# Patient Record
Sex: Male | Born: 1940
Health system: Southern US, Community
[De-identification: ages and names within clinical notes are randomized; demographics above are authoritative.]

## PROBLEM LIST (undated history)

## (undated) DIAGNOSIS — Z79899 Other long term (current) drug therapy: Secondary | ICD-10-CM

## (undated) DIAGNOSIS — Z8679 Personal history of other diseases of the circulatory system: Secondary | ICD-10-CM

## (undated) DIAGNOSIS — E7800 Pure hypercholesterolemia, unspecified: Secondary | ICD-10-CM

## (undated) DIAGNOSIS — I214 Non-ST elevation (NSTEMI) myocardial infarction: Secondary | ICD-10-CM

## (undated) DIAGNOSIS — J849 Interstitial pulmonary disease, unspecified: Secondary | ICD-10-CM

## (undated) DIAGNOSIS — I10 Essential (primary) hypertension: Secondary | ICD-10-CM

## (undated) DIAGNOSIS — I739 Peripheral vascular disease, unspecified: Secondary | ICD-10-CM

## (undated) DIAGNOSIS — K5790 Diverticulosis of intestine, part unspecified, without perforation or abscess without bleeding: Secondary | ICD-10-CM

## (undated) DIAGNOSIS — C61 Malignant neoplasm of prostate: Secondary | ICD-10-CM

## (undated) DIAGNOSIS — E119 Type 2 diabetes mellitus without complications: Secondary | ICD-10-CM

## (undated) DIAGNOSIS — I2510 Atherosclerotic heart disease of native coronary artery without angina pectoris: Secondary | ICD-10-CM

## (undated) DIAGNOSIS — I209 Angina pectoris, unspecified: Secondary | ICD-10-CM

## (undated) HISTORY — DX: Interstitial pulmonary disease, unspecified: J84.9

## (undated) HISTORY — DX: Atherosclerotic heart disease of native coronary artery without angina pectoris: I25.10

## (undated) HISTORY — DX: Angina pectoris, unspecified: I20.9

## (undated) HISTORY — DX: Non-ST elevation (NSTEMI) myocardial infarction: I21.4

## (undated) HISTORY — DX: Other long term (current) drug therapy: Z79.899

## (undated) HISTORY — PX: TONSILLECTOMY: SHX5217

## (undated) HISTORY — PX: PROSTATECTOMY: SHX69

## (undated) HISTORY — DX: Personal history of other diseases of the circulatory system: Z86.79

## (undated) HISTORY — DX: Essential (primary) hypertension: I10

## (undated) HISTORY — DX: Peripheral vascular disease, unspecified: I73.9

## (undated) HISTORY — PX: CORONARY ANGIOPLASTY: SHX604

## (undated) HISTORY — DX: Malignant neoplasm of prostate: C61

## (undated) HISTORY — DX: Pure hypercholesterolemia, unspecified: E78.00

## (undated) HISTORY — PX: CARDIAC CATHETERIZATION: SHX172

## (undated) HISTORY — DX: Diverticulosis of intestine, part unspecified, without perforation or abscess without bleeding: K57.90

## (undated) HISTORY — DX: Type 2 diabetes mellitus without complications: E11.9

---

## 2004-07-20 ENCOUNTER — Emergency Department (HOSPITAL_COMMUNITY): Admission: EM | Admit: 2004-07-20 | Discharge: 2004-07-20 | Payer: Self-pay | Admitting: Family Medicine

## 2008-04-18 ENCOUNTER — Inpatient Hospital Stay (HOSPITAL_COMMUNITY): Admission: AD | Admit: 2008-04-18 | Discharge: 2008-04-20 | Payer: Self-pay | Admitting: Cardiology

## 2008-04-18 DIAGNOSIS — Z8679 Personal history of other diseases of the circulatory system: Secondary | ICD-10-CM

## 2008-04-18 HISTORY — DX: Personal history of other diseases of the circulatory system: Z86.79

## 2008-04-19 ENCOUNTER — Encounter: Admission: RE | Admit: 2008-04-19 | Discharge: 2008-04-20 | Payer: Self-pay | Admitting: Cardiology

## 2008-04-19 ENCOUNTER — Encounter (INDEPENDENT_AMBULATORY_CARE_PROVIDER_SITE_OTHER): Payer: Self-pay | Admitting: Cardiology

## 2009-04-18 ENCOUNTER — Ambulatory Visit: Payer: Self-pay | Admitting: Cardiology

## 2010-06-09 ENCOUNTER — Inpatient Hospital Stay (HOSPITAL_COMMUNITY): Admission: RE | Admit: 2010-06-09 | Discharge: 2010-06-10 | Payer: Self-pay | Admitting: Cardiology

## 2010-06-17 HISTORY — PX: LEFT HEART CATH: SHX5946

## 2010-06-17 HISTORY — PX: PERCUTANEOUS CORONARY STENT INTERVENTION (PCI-S): SHX6016

## 2010-11-12 LAB — BASIC METABOLIC PANEL
BUN: 13 mg/dL (ref 6–23)
CO2: 27 mEq/L (ref 19–32)
Calcium: 8.6 mg/dL (ref 8.4–10.5)
Chloride: 107 mEq/L (ref 96–112)
Creatinine, Ser: 0.73 mg/dL (ref 0.4–1.5)
GFR calc Af Amer: >60 mL/min (ref >60)
GFR calc non Af Amer: >60 mL/min (ref >60)
Glucose, Bld: 130 mg/dL — ABNORMAL HIGH (ref 70–99)
Potassium: 4 mEq/L (ref 3.5–5.1)
Sodium: 139 mEq/L (ref 135–145)

## 2010-11-12 LAB — GLUCOSE, CAPILLARY
Glucose-Capillary: 100 mg/dL — ABNORMAL HIGH (ref 70–99)
Glucose-Capillary: 116 mg/dL — ABNORMAL HIGH (ref 70–99)
Glucose-Capillary: 128 mg/dL — ABNORMAL HIGH (ref 70–99)

## 2010-11-12 LAB — CBC
HCT: 39.4 % (ref 39.0–52.0)
Hemoglobin: 13.3 g/dL (ref 13.0–17.0)
MCH: 28.8 pg (ref 26.0–34.0)
MCHC: 33.8 g/dL (ref 30.0–36.0)
MCV: 85.3 fL (ref 78.0–100.0)
Platelets: 195 10*3/uL (ref 150–400)
RBC: 4.62 MIL/uL (ref 4.22–5.81)
RDW: 13.1 % (ref 11.5–15.5)
WBC: 8.7 10*3/uL (ref 4.0–10.5)

## 2011-01-16 NOTE — Discharge Summary (Signed)
NAME:  ARDIE, MCLENNAN NO.:  [id number]   MEDICAL RECORD NO.:  [id number]          PATIENT TYPE:  INP   LOCATION:  6522                         FACILITY:  [facility]   PHYSICIAN:  Francisca December, M.D.  DATE OF BIRTH:  09/29/1940   DATE OF ADMISSION:  04/18/2008  DATE OF DISCHARGE:  04/20/2008                               DISCHARGE SUMMARY   DISCHARGE DIAGNOSES:  1. Non-ST segment elevated myocardial infarction.  2. Coronary artery disease status post drug-eluting stents to the left      anterior descending and right coronary artery.  3. Hypertension.  4. Diabetes mellitus, diet-controlled.  5. Hyperlipidemia.  6. Family history of coronary artery disease.  7. History of prostate cancer.  8. Diverticulosis.   HOSPITAL COURSE:  Mr. Bluestein is a 70 year old male patient who was  admitted to [facility] on April 18, 2008, following a cardiac  catheterization on April 10, 2008, that showed two-vessel coronary  artery disease in the right coronary artery and left anterior descending  artery.  He was admitted for coronary intervention.   He underwent coronary intervention utilizing drug-eluting stents to  these vessels.  He tolerated the procedure well.  Interestingly, the  patient's cardiac markers increased periprocedure with a troponin as  high as 3.81.  Dr. Amil Amen felt that this elevation could be secondary  to a jailed acute marginal.  The patient was kept in the hospital  overnight to watch.  A 2-D echo was performed for precautionary measures  and this showed a normal EF with mild focal basal septal hypertrophy.  The patient was finally discharged to home on April 20, 2008.  He was  pain free.   DISCHARGE MEDICATIONS:  1. Plavix 75 mg a day for a minimum of 1 year.  2. Enteric-coated aspirin 325 mg a day.  3. Sublingual nitroglycerin p.r.n. chest pain.  4. Toprol-XL 25 mg a day.  5. Benazepril/hydrochlorothiazide 40/25 mg once a day.   DISCHARGE  INSTRUCTIONS:  The patient is to remain on a diabetic diet.  Clean cath site gently with soap and water.  No scrubbing.  Increase  activity slowly.  No lifting over 10 pounds for 1 week.  No driving for  2 days.  Follow up with Dr. Deitra Mayo nurse practitioner on  May 01, 2008 at 1:20 p.m.      Guy Franco, P.A.      Francisca December, M.D.  Electronically Signed    LB/MEDQ  D:  05/26/2008  T:  05/27/2008  Job:  [id number]

## 2013-10-02 ENCOUNTER — Telehealth: Payer: Self-pay | Admitting: *Deleted

## 2013-10-02 MED ORDER — METOPROLOL SUCCINATE ER 100 MG PO TB24: 100.0000 mg | ORAL_TABLET | Freq: Every day | ORAL | Status: DC

## 2013-10-02 NOTE — Telephone Encounter (Signed)
Rx sent in for pt.

## 2013-10-02 NOTE — Telephone Encounter (Signed)
Patient is out of metoprolol and requests a refill be sent to [facility] in [place]. Thanks, MI

## 2013-10-07 ENCOUNTER — Encounter: Payer: Self-pay | Admitting: *Deleted

## 2013-11-16 ENCOUNTER — Ambulatory Visit: Payer: Self-pay | Admitting: Cardiology

## 2013-11-24 ENCOUNTER — Ambulatory Visit: Payer: Self-pay | Admitting: Cardiology

## 2013-12-14 ENCOUNTER — Ambulatory Visit: Payer: Self-pay | Admitting: Cardiology

## 2014-01-08 ENCOUNTER — Encounter: Payer: Self-pay | Admitting: Cardiology

## 2014-01-08 ENCOUNTER — Ambulatory Visit (INDEPENDENT_AMBULATORY_CARE_PROVIDER_SITE_OTHER): Payer: Medicare Other | Admitting: Cardiology

## 2014-01-08 VITALS — BP 160/86 | HR 82 | Ht 65.0 in | Wt 217.0 lb

## 2014-01-08 DIAGNOSIS — I2510 Atherosclerotic heart disease of native coronary artery without angina pectoris: Secondary | ICD-10-CM | POA: Diagnosis not present

## 2014-01-08 DIAGNOSIS — I739 Peripheral vascular disease, unspecified: Secondary | ICD-10-CM | POA: Insufficient documentation

## 2014-01-08 DIAGNOSIS — E7800 Pure hypercholesterolemia, unspecified: Secondary | ICD-10-CM | POA: Insufficient documentation

## 2014-01-08 DIAGNOSIS — I10 Essential (primary) hypertension: Secondary | ICD-10-CM | POA: Diagnosis not present

## 2014-01-08 MED ORDER — ASPIRIN 81 MG PO TABS: 81.0000 mg | ORAL_TABLET | Freq: Every day | ORAL | Status: DC

## 2014-01-08 NOTE — Patient Instructions (Signed)
Your physician has recommended you make the following change in your medication:   1. Decrease Aspirin to 81 Mg 1 tablet daily  Your physician recommends that you return for a FASTING Lipid and ALT Profile. Please schedule before you leave today.  Your physician wants you to follow-up in: 1 year with Dr Mallie Snooks will receive a reminder letter in the mail two months in advance. If you don't receive a letter, please call our office to schedule the follow-up appointment.

## 2014-01-08 NOTE — Progress Notes (Signed)
[place], [place], [place]  [address] Phone: [phone] Fax:  [phone]  Date:  01/08/2014   ID:  Adam Washington, DOB [birth year], MRN [id number]  PCP:  No primary provider on file.  Cardiologist:  Fransico Him, MD     History of Present Illness: Adam Washington is a 73 y.o. male with a history of ASCAD s/p PCI of the mid LAD 8/09 and then platinumm study stent to the distal RCA complicated by NSTEMI from jailed stent with occlusion of the RV marginal and then PCI of the distal RCA 10/11.  He also has a history of dyslipidemia and HTN.  He is doing well.  He denies any chest pain, dizziness, palpitations or syncope.  He occasionally has some mild DOE which is stable.  He has some mild LE edema by the end of the day occasionally.  Wt Readings from Last 3 Encounters:  01/08/14 217 lb (98.431 kg)  05/15/13 218 lb 9.6 oz (99.156 kg)     Past Medical History  Diagnosis Date  . History of ASCVD 04/18/08    2 vessel. s/p drug-elutign stent, mid LAD  . NSTEMI (non-ST elevated myocardial infarction)     occluded RV marginal (stent jail)  . Angina pectoris   . Type II or unspecified type diabetes mellitus without mention of complication, not stated as uncontrolled   . Prostate carcinoma   . Diverticulosis   . Encounter for long-term (current) use of other medications   . Coronary atherosclerosis of native coronary artery     s/p PCI of the mid LAD 2009, paltinum study stent RCA 4235 complicated by NSTEMI from occluded RV marginal from jailed stent, repeat cath 2011 PCI of distal RCA  . Essential hypertension, benign   . Pure hypercholesterolemia   . PVD (peripheral vascular disease)     abnormal LE arterial dopplers with mild nonobstructive disease throughout RLE, moderate mid left SFA stenosis, normal resting ABI's    Current Outpatient Prescriptions  Medication Sig Dispense Refill  . amLODipine (NORVASC) 5 MG tablet Take 5 mg by mouth daily.      Marland Kitchen aspirin 325 MG tablet  Take 325 mg by mouth daily.      . benazepril-hydrochlorthiazide (LOTENSIN HCT) 20-12.5 MG per tablet Take 1 tablet by mouth daily.      Marland Kitchen glipiZIDE (GLUCOTROL XL) 10 MG 24 hr tablet       . metoprolol succinate (TOPROL-XL) 100 MG 24 hr tablet Take 1 tablet (100 mg total) by mouth daily. Take with or immediately following a meal.  30 tablet  11  . nitroGLYCERIN (NITROSTAT) 0.4 MG SL tablet Place 0.4 mg under the tongue every 5 (five) minutes as needed for chest pain.      . pioglitazone-metformin (ACTOPLUS MET) 15-500 MG per tablet Take 1 tablet by mouth daily.      . rosuvastatin (CRESTOR) 40 MG tablet Take 40 mg by mouth daily.       No current facility-administered medications for this visit.    Allergies:    Allergies  Allergen Reactions  . Plavix [Clopidogrel Bisulfate] Hives    Social History:  The patient  reports that he has quit smoking. He does not have any smokeless tobacco history on file. He reports that he drinks alcohol. He reports that he does not use illicit drugs.   Family History:  The patient's family history includes Alzheimer's disease in his mother; Cancer in his brother and father; Heart attack in his  brother, brother, father, and mother; Heart disease in his brother, brother, father, and mother.   ROS:  Please see the history of present illness.      All other systems reviewed and negative.   PHYSICAL EXAM: VS:  BP 160/86  Pulse 82  Ht $R'5\' 5"'Jm$  (1.651 m)  Wt 217 lb (98.431 kg)  BMI 36.11 kg/m2 Well nourished, well developed, in no acute distress HEENT: normal Neck: no JVD Cardiac:  normal S1, S2; RRR; no murmur Lungs:  clear to auscultation bilaterally, no wheezing, rhonchi or rales Abd: soft, nontender, no hepatomegaly Ext: no edema Skin: warm and dry Neuro:  CNs 2-12 intact, no focal abnormalities noted  EKG:     NSR with LVH by voltage  ASSESSMENT AND PLAN:  1. ASCAD with history of PCI of mid LAD and distal RCA x 2 with no current angina -  decrease to ASA to $Remo'81mg'VAaEw$  daily 2. HTN elevated today but he did not take his BP meds yesterday - continue Amlodipine/Lotensin HCT/Toprol 3. Dyslipidemia - continue Crestor - check fasting lipid and ALT  Followup with me in 1 year  Signed, Fransico Him, MD 01/08/2014 10:34 AM

## 2014-01-09 ENCOUNTER — Other Ambulatory Visit: Payer: Self-pay

## 2014-01-09 MED ORDER — BENAZEPRIL-HYDROCHLOROTHIAZIDE 20-12.5 MG PO TABS: 1.0000 | ORAL_TABLET | Freq: Every day | ORAL | Status: DC

## 2014-01-11 DIAGNOSIS — E1139 Type 2 diabetes mellitus with other diabetic ophthalmic complication: Secondary | ICD-10-CM | POA: Diagnosis not present

## 2014-01-11 DIAGNOSIS — Z961 Presence of intraocular lens: Secondary | ICD-10-CM | POA: Diagnosis not present

## 2014-01-11 DIAGNOSIS — H35379 Puckering of macula, unspecified eye: Secondary | ICD-10-CM | POA: Diagnosis not present

## 2014-01-11 DIAGNOSIS — E11319 Type 2 diabetes mellitus with unspecified diabetic retinopathy without macular edema: Secondary | ICD-10-CM | POA: Diagnosis not present

## 2014-01-15 ENCOUNTER — Other Ambulatory Visit: Payer: Self-pay | Admitting: General Surgery

## 2014-01-15 ENCOUNTER — Other Ambulatory Visit (INDEPENDENT_AMBULATORY_CARE_PROVIDER_SITE_OTHER): Payer: Medicare Other

## 2014-01-15 DIAGNOSIS — E7800 Pure hypercholesterolemia, unspecified: Secondary | ICD-10-CM | POA: Diagnosis not present

## 2014-01-15 DIAGNOSIS — Z1211 Encounter for screening for malignant neoplasm of colon: Secondary | ICD-10-CM | POA: Diagnosis not present

## 2014-01-15 DIAGNOSIS — I10 Essential (primary) hypertension: Secondary | ICD-10-CM | POA: Diagnosis not present

## 2014-01-15 DIAGNOSIS — IMO0001 Reserved for inherently not codable concepts without codable children: Secondary | ICD-10-CM | POA: Diagnosis not present

## 2014-01-15 DIAGNOSIS — I2510 Atherosclerotic heart disease of native coronary artery without angina pectoris: Secondary | ICD-10-CM | POA: Diagnosis not present

## 2014-01-15 LAB — LIPID PANEL
Cholesterol: 159 mg/dL (ref 0–200)
HDL: 41.3 mg/dL (ref >39.00)
LDL Cholesterol: 88 mg/dL (ref 0–99)
Total CHOL/HDL Ratio: 4
Triglycerides: 147 mg/dL (ref 0.0–149.0)
VLDL: 29.4 mg/dL (ref 0.0–40.0)

## 2014-01-15 LAB — HEPATIC FUNCTION PANEL
ALT: 22 U/L (ref 0–53)
AST: 26 U/L (ref 0–37)
Albumin: 4 g/dL (ref 3.5–5.2)
Alkaline Phosphatase: 52 U/L (ref 39–117)
Bilirubin, Direct: 0.1 mg/dL (ref 0.0–0.3)
Total Bilirubin: 0.7 mg/dL (ref 0.2–1.2)
Total Protein: 7.4 g/dL (ref 6.0–8.3)

## 2014-01-15 MED ORDER — BENAZEPRIL-HYDROCHLOROTHIAZIDE 20-12.5 MG PO TABS: 2.0000 | ORAL_TABLET | Freq: Every day | ORAL | Status: DC

## 2014-01-25 ENCOUNTER — Encounter: Payer: Self-pay | Admitting: Cardiology

## 2014-01-26 ENCOUNTER — Encounter: Payer: Self-pay | Admitting: Cardiology

## 2014-02-07 ENCOUNTER — Other Ambulatory Visit: Payer: Self-pay | Admitting: *Deleted

## 2014-02-07 MED ORDER — AMLODIPINE BESYLATE 5 MG PO TABS: 5.0000 mg | ORAL_TABLET | Freq: Every day | ORAL | Status: DC

## 2014-02-12 ENCOUNTER — Encounter: Payer: Self-pay | Admitting: General Surgery

## 2014-02-12 ENCOUNTER — Other Ambulatory Visit: Payer: Self-pay | Admitting: General Surgery

## 2014-02-12 DIAGNOSIS — E7800 Pure hypercholesterolemia, unspecified: Secondary | ICD-10-CM

## 2014-02-12 MED ORDER — EZETIMIBE 10 MG PO TABS: 10.0000 mg | ORAL_TABLET | Freq: Every day | ORAL | Status: DC

## 2014-03-08 ENCOUNTER — Other Ambulatory Visit: Payer: Self-pay

## 2014-03-08 MED ORDER — BENAZEPRIL-HYDROCHLOROTHIAZIDE 20-12.5 MG PO TABS: 1.0000 | ORAL_TABLET | Freq: Two times a day (BID) | ORAL | Status: DC

## 2014-03-23 ENCOUNTER — Telehealth: Payer: Self-pay | Admitting: Cardiology

## 2014-03-23 MED ORDER — HYDROCHLOROTHIAZIDE 12.5 MG PO CAPS: 12.5000 mg | ORAL_CAPSULE | Freq: Every day | ORAL | Status: DC

## 2014-03-23 MED ORDER — BENAZEPRIL HCL 20 MG PO TABS: 20.0000 mg | ORAL_TABLET | Freq: Every day | ORAL | Status: DC

## 2014-03-23 NOTE — Telephone Encounter (Signed)
Spoke with pt and pts wife to let them know both rx's have been sent in.

## 2014-03-23 NOTE — Telephone Encounter (Signed)
New message     Insurance will not pay for benazepril/HCTZ together.  Need separate pres [facility] to [facility].  They want the HCTZ capsule.  He is out of medication

## 2014-04-24 DIAGNOSIS — H43819 Vitreous degeneration, unspecified eye: Secondary | ICD-10-CM | POA: Diagnosis not present

## 2014-04-24 DIAGNOSIS — D3130 Benign neoplasm of unspecified choroid: Secondary | ICD-10-CM | POA: Diagnosis not present

## 2014-04-24 DIAGNOSIS — E1139 Type 2 diabetes mellitus with other diabetic ophthalmic complication: Secondary | ICD-10-CM | POA: Diagnosis not present

## 2014-04-24 DIAGNOSIS — E11329 Type 2 diabetes mellitus with mild nonproliferative diabetic retinopathy without macular edema: Secondary | ICD-10-CM | POA: Diagnosis not present

## 2014-04-24 DIAGNOSIS — H35379 Puckering of macula, unspecified eye: Secondary | ICD-10-CM | POA: Diagnosis not present

## 2014-04-30 DIAGNOSIS — J311 Chronic nasopharyngitis: Secondary | ICD-10-CM | POA: Diagnosis not present

## 2014-04-30 DIAGNOSIS — J310 Chronic rhinitis: Secondary | ICD-10-CM | POA: Diagnosis not present

## 2014-04-30 DIAGNOSIS — R059 Cough, unspecified: Secondary | ICD-10-CM | POA: Diagnosis not present

## 2014-04-30 DIAGNOSIS — G479 Sleep disorder, unspecified: Secondary | ICD-10-CM | POA: Diagnosis not present

## 2014-04-30 DIAGNOSIS — R05 Cough: Secondary | ICD-10-CM | POA: Diagnosis not present

## 2014-05-15 ENCOUNTER — Encounter: Payer: Self-pay | Admitting: General Surgery

## 2014-05-15 ENCOUNTER — Other Ambulatory Visit (INDEPENDENT_AMBULATORY_CARE_PROVIDER_SITE_OTHER): Payer: Medicare Other

## 2014-05-15 DIAGNOSIS — E7800 Pure hypercholesterolemia, unspecified: Secondary | ICD-10-CM

## 2014-05-15 LAB — LIPID PANEL
Cholesterol: 124 mg/dL (ref 0–200)
HDL: 32.9 mg/dL — ABNORMAL LOW (ref >39.00)
LDL Cholesterol: 62 mg/dL (ref 0–99)
NonHDL: 91.1
Total CHOL/HDL Ratio: 4
Triglycerides: 146 mg/dL (ref 0.0–149.0)
VLDL: 29.2 mg/dL (ref 0.0–40.0)

## 2014-05-15 LAB — HEPATIC FUNCTION PANEL
ALT: 28 U/L (ref 0–53)
AST: 35 U/L (ref 0–37)
Albumin: 3.9 g/dL (ref 3.5–5.2)
Alkaline Phosphatase: 58 U/L (ref 39–117)
Bilirubin, Direct: 0 mg/dL (ref 0.0–0.3)
Total Bilirubin: 0.7 mg/dL (ref 0.2–1.2)
Total Protein: 7.6 g/dL (ref 6.0–8.3)

## 2014-07-30 DIAGNOSIS — E780 Pure hypercholesterolemia: Secondary | ICD-10-CM | POA: Diagnosis not present

## 2014-07-30 DIAGNOSIS — Z1389 Encounter for screening for other disorder: Secondary | ICD-10-CM | POA: Diagnosis not present

## 2014-07-30 DIAGNOSIS — C61 Malignant neoplasm of prostate: Secondary | ICD-10-CM | POA: Diagnosis not present

## 2014-07-30 DIAGNOSIS — R05 Cough: Secondary | ICD-10-CM | POA: Diagnosis not present

## 2014-07-30 DIAGNOSIS — E1165 Type 2 diabetes mellitus with hyperglycemia: Secondary | ICD-10-CM | POA: Diagnosis not present

## 2014-07-30 DIAGNOSIS — Z9181 History of falling: Secondary | ICD-10-CM | POA: Diagnosis not present

## 2014-07-30 DIAGNOSIS — I2510 Atherosclerotic heart disease of native coronary artery without angina pectoris: Secondary | ICD-10-CM | POA: Diagnosis not present

## 2014-08-25 DIAGNOSIS — R509 Fever, unspecified: Secondary | ICD-10-CM | POA: Diagnosis not present

## 2014-08-25 DIAGNOSIS — R05 Cough: Secondary | ICD-10-CM | POA: Diagnosis not present

## 2014-08-25 DIAGNOSIS — J984 Other disorders of lung: Secondary | ICD-10-CM | POA: Diagnosis not present

## 2014-09-04 DIAGNOSIS — J189 Pneumonia, unspecified organism: Secondary | ICD-10-CM | POA: Diagnosis not present

## 2014-09-04 DIAGNOSIS — Z1389 Encounter for screening for other disorder: Secondary | ICD-10-CM | POA: Diagnosis not present

## 2014-09-04 DIAGNOSIS — Z9181 History of falling: Secondary | ICD-10-CM | POA: Diagnosis not present

## 2014-09-05 ENCOUNTER — Other Ambulatory Visit: Payer: Self-pay

## 2014-09-05 MED ORDER — ROSUVASTATIN CALCIUM 40 MG PO TABS: 40.0000 mg | ORAL_TABLET | Freq: Every day | ORAL | Status: DC

## 2014-10-15 ENCOUNTER — Encounter: Payer: Self-pay | Admitting: Cardiology

## 2014-11-23 ENCOUNTER — Other Ambulatory Visit: Payer: Self-pay

## 2014-11-23 MED ORDER — METOPROLOL SUCCINATE ER 100 MG PO TB24: 100.0000 mg | ORAL_TABLET | Freq: Every day | ORAL | Status: DC

## 2015-01-10 NOTE — Progress Notes (Signed)
Cardiology Office Note   Date:  01/11/2015   ID:  Felix Pacini, DOB 1941/08/23, MRN [id number]  PCP:  Fae Pippin    Chief Complaint  Patient presents with  . Coronary Artery Disease  . Follow-up    hypertension  . Follow-up    hyperlipidemia      History of Present Illness: Roshun Klingensmith is a 74 y.o. male with a history of ASCAD s/p PCI of the mid LAD 8/09 and then platinumm study stent to the distal RCA complicated by NSTEMI from jailed stent with occlusion of the RV marginal and then PCI of the distal RCA 10/11. He also has a history of dyslipidemia and HTN. He is doing well. He denies any chest pain, dizziness, palpitations or syncope. He occasionally has some mild DOE but he thinks it has gotten worse since his PNA in December. He has some mild LE edema by the end of the day occasionally.    Past Medical History  Diagnosis Date  . History of ASCVD 04/18/08    2 vessel. s/p drug-elutign stent, mid LAD  . NSTEMI (non-ST elevated myocardial infarction)     occluded RV marginal (stent jail)  . Angina pectoris   . Type II or unspecified type diabetes mellitus without mention of complication, not stated as uncontrolled   . Prostate carcinoma   . Diverticulosis   . Encounter for long-term (current) use of other medications   . Coronary atherosclerosis of native coronary artery     s/p PCI of the mid LAD 2009, paltinum study stent RCA 0109 complicated by NSTEMI from occluded RV marginal from jailed stent, repeat cath 2011 PCI of distal RCA  . Essential hypertension, benign   . Pure hypercholesterolemia   . PVD (peripheral vascular disease)     abnormal LE arterial dopplers with mild nonobstructive disease throughout RLE, moderate mid left SFA stenosis, normal resting ABI's    Past Surgical History  Procedure Laterality Date  . Prostatectomy      total  . Tonsillectomy      remote  . Percutaneous coronary stent intervention (pci-s)  06/17/10  . Left heart  cath  06/17/10     Current Outpatient Prescriptions  Medication Sig Dispense Refill  . amLODipine (NORVASC) 5 MG tablet Take 1 tablet (5 mg total) by mouth daily. 90 tablet 1  . aspirin 81 MG tablet Take 1 tablet (81 mg total) by mouth daily.    . benazepril-hydrochlorthiazide (LOTENSIN HCT) 20-12.5 MG per tablet Take 1 tablet by mouth daily.    Marland Kitchen ezetimibe (ZETIA) 10 MG tablet Take 1 tablet (10 mg total) by mouth daily. 90 tablet 3  . glipiZIDE (GLUCOTROL XL) 10 MG 24 hr tablet     . hydrochlorothiazide (MICROZIDE) 12.5 MG capsule Take 1 capsule (12.5 mg total) by mouth daily. 90 capsule 2  . metoprolol succinate (TOPROL-XL) 100 MG 24 hr tablet Take 1 tablet (100 mg total) by mouth daily. Take with or immediately following a meal. 30 tablet 6  . nitroGLYCERIN (NITROSTAT) 0.4 MG SL tablet Place 0.4 mg under the tongue every 5 (five) minutes as needed for chest pain.    . pioglitazone-metformin (ACTOPLUS MET) 15-500 MG per tablet Take 1 tablet by mouth daily.    . rosuvastatin (CRESTOR) 40 MG tablet Take 1 tablet (40 mg total) by mouth daily. 30 tablet 6   No current facility-administered medications for this visit.    Allergies:   Plavix    Social History:  The patient  reports that he has quit smoking. He does not have any smokeless tobacco history on file. He reports that he drinks alcohol. He reports that he does not use illicit drugs.   Family History:  The patient's family history includes Alzheimer's disease in his mother; Cancer in his brother and father; Heart attack in his brother, brother, father, and mother; Heart disease in his brother, brother, father, and mother.    ROS:  Please see the history of present illness.   Otherwise, review of systems are positive for none.   All other systems are reviewed and negative.    PHYSICAL EXAM: VS:  BP 148/78 mmHg  Pulse 84  Ht $R'5\' 5"'kU$  (1.651 m)  Wt 208 lb 6.4 oz (94.53 kg)  BMI 34.68 kg/m2 , BMI Body mass index is 34.68  kg/(m^2). GEN: Well nourished, well developed, in no acute distress HEENT: normal Neck: no JVD, carotid bruits, or masses Cardiac: RRR; no murmurs, rubs, or gallops,no edema  Respiratory:  clear to auscultation bilaterally, normal work of breathing GI: soft, nontender, nondistended, + BS MS: no deformity or atrophy Skin: warm and dry, no rash Neuro:  Strength and sensation are intact Psych: euthymic mood, full affect   EKG:  EKG was ordered today and showed NSR at 84bpm with no ST changes and LVH by voltage    Recent Labs: 05/15/2014: ALT 28    Lipid Panel    Component Value Date/Time   CHOL 124 05/15/2014 0741   TRIG 146.0 05/15/2014 0741   HDL 32.90* 05/15/2014 0741   CHOLHDL 4 05/15/2014 0741   VLDL 29.2 05/15/2014 0741   LDLCALC 62 05/15/2014 0741      Wt Readings from Last 3 Encounters:  01/11/15 208 lb 6.4 oz (94.53 kg)  01/08/14 217 lb (98.431 kg)  05/15/13 218 lb 9.6 oz (99.156 kg)     ASSESSMENT AND PLAN: 1.  ASCAD with history of PCI of mid LAD and distal RCA x 2 with no current angina but does have worsening DOE.  This most likely is due to recent PNA but he has not had a noninvasive ischemic w/u in some time.   - continue ASA - I will get a Lexiscan myoview to evaluate 2.  HTN - borderline controlled - continue Amlodipine/Lotensin HCT/Toprol 3.  Dyslipidemia - continue Crestor/Zetia - check fasting lipid and ALT 4.  Atypical moles - he has 2 irregular appearing nevi on his left forearm that I recommended that he see his PCP to get biopsied.   Current medicines are reviewed at length with the patient today.  The patient does not have concerns regarding medicines.  The following changes have been made:  no change  Labs/ tests ordered today include: see above assessment and plan No orders of the defined types were placed in this encounter.     Disposition:   FU with me in 6 months   Signed, Sueanne Margarita, MD  01/11/2015 3:20 PM    [facility] [address], [place], [place]  [address] Phone: [phone]; Fax: [phone]

## 2015-01-11 ENCOUNTER — Encounter: Payer: Self-pay | Admitting: Cardiology

## 2015-01-11 ENCOUNTER — Ambulatory Visit (INDEPENDENT_AMBULATORY_CARE_PROVIDER_SITE_OTHER): Payer: Medicare Other | Admitting: Cardiology

## 2015-01-11 VITALS — BP 148/78 | HR 84 | Ht 65.0 in | Wt 208.4 lb

## 2015-01-11 DIAGNOSIS — R0602 Shortness of breath: Secondary | ICD-10-CM | POA: Insufficient documentation

## 2015-01-11 DIAGNOSIS — I10 Essential (primary) hypertension: Secondary | ICD-10-CM | POA: Diagnosis not present

## 2015-01-11 DIAGNOSIS — I2510 Atherosclerotic heart disease of native coronary artery without angina pectoris: Secondary | ICD-10-CM

## 2015-01-11 DIAGNOSIS — E7800 Pure hypercholesterolemia, unspecified: Secondary | ICD-10-CM

## 2015-01-11 NOTE — Patient Instructions (Addendum)
Medication Instructions:  Your physician recommends that you continue on your current medications as directed. Please refer to the Current Medication list given to you today.   Labwork: Your physician recommends that you return for FASTING lab work the [facility] as your stress test. (BMET, lipids, LFTs)  Testing/Procedures: Your physician has requested that you have a lexiscan myoview. For further information please visit [facility]uge[facility]iesta[facility]tn. Please follow instruction sheet, as given.  Follow-Up: Your physician wants you to follow-up in: 6 months with Dr. Radford Pax. You will receive a reminder letter in the mail two months in advance. If you don't receive a letter, please call our office to schedule the follow-up appointment.   Any Other Special Instructions Will Be Listed Below (If Applicable).

## 2015-01-15 DIAGNOSIS — H35371 Puckering of macula, right eye: Secondary | ICD-10-CM | POA: Diagnosis not present

## 2015-01-15 DIAGNOSIS — D3131 Benign neoplasm of right choroid: Secondary | ICD-10-CM | POA: Diagnosis not present

## 2015-01-15 DIAGNOSIS — E11329 Type 2 diabetes mellitus with mild nonproliferative diabetic retinopathy without macular edema: Secondary | ICD-10-CM | POA: Diagnosis not present

## 2015-01-15 DIAGNOSIS — H43813 Vitreous degeneration, bilateral: Secondary | ICD-10-CM | POA: Diagnosis not present

## 2015-01-22 ENCOUNTER — Telehealth (HOSPITAL_COMMUNITY): Payer: Self-pay | Admitting: *Deleted

## 2015-01-22 NOTE — Telephone Encounter (Signed)
Patient given detailed instructions per Myocardial Perfusion Study Information Sheet for test on 01/25/15 at 0745. Patient was asked to change appointment to 0745 instead of 0945. Patient stated that was fine,Patient verbalized understanding. Armani Gawlik, Ranae Palms

## 2015-01-24 DIAGNOSIS — Z6832 Body mass index (BMI) 32.0-32.9, adult: Secondary | ICD-10-CM | POA: Diagnosis not present

## 2015-01-24 DIAGNOSIS — I2510 Atherosclerotic heart disease of native coronary artery without angina pectoris: Secondary | ICD-10-CM | POA: Diagnosis not present

## 2015-01-24 DIAGNOSIS — I10 Essential (primary) hypertension: Secondary | ICD-10-CM | POA: Diagnosis not present

## 2015-01-24 DIAGNOSIS — E1165 Type 2 diabetes mellitus with hyperglycemia: Secondary | ICD-10-CM | POA: Diagnosis not present

## 2015-01-24 DIAGNOSIS — Z1389 Encounter for screening for other disorder: Secondary | ICD-10-CM | POA: Diagnosis not present

## 2015-01-24 DIAGNOSIS — L989 Disorder of the skin and subcutaneous tissue, unspecified: Secondary | ICD-10-CM | POA: Diagnosis not present

## 2015-01-25 ENCOUNTER — Other Ambulatory Visit (INDEPENDENT_AMBULATORY_CARE_PROVIDER_SITE_OTHER): Payer: Medicare Other | Admitting: *Deleted

## 2015-01-25 ENCOUNTER — Ambulatory Visit (HOSPITAL_COMMUNITY): Payer: Medicare Other | Attending: Cardiology

## 2015-01-25 DIAGNOSIS — E7800 Pure hypercholesterolemia, unspecified: Secondary | ICD-10-CM

## 2015-01-25 DIAGNOSIS — I10 Essential (primary) hypertension: Secondary | ICD-10-CM

## 2015-01-25 DIAGNOSIS — R0602 Shortness of breath: Secondary | ICD-10-CM | POA: Diagnosis not present

## 2015-01-25 LAB — MYOCARDIAL PERFUSION IMAGING
CHL CUP NUCLEAR SDS: 1
CHL CUP NUCLEAR SRS: 9
CHL CUP NUCLEAR SSS: 10
CHL CUP STRESS STAGE 1 SPEED: 0 mph
CHL CUP STRESS STAGE 2 SPEED: 0 mph
CHL CUP STRESS STAGE 3 GRADE: 0 %
CHL CUP STRESS STAGE 3 SPEED: 0 mph
CHL CUP STRESS STAGE 4 HR: 89 {beats}/min
CHL CUP STRESS STAGE 4 SPEED: 0 mph
CHL CUP STRESS STAGE 5 GRADE: 0 %
CSEPPHR: 89 {beats}/min
Estimated workload: 1 METS
LV sys vol: 58 mL
LVDIAVOL: 120 mL
Nuc Stress EF: 52 %
Percent of predicted max HR: 60 %
RATE: 0.38
Rest HR: 71 {beats}/min
Stage 1 DBP: 83 mmHg
Stage 1 Grade: 0 %
Stage 1 HR: 76 {beats}/min
Stage 1 SBP: 183 mmHg
Stage 2 Grade: 0 %
Stage 2 HR: 76 {beats}/min
Stage 3 DBP: 69 mmHg
Stage 3 HR: 91 {beats}/min
Stage 3 SBP: 182 mmHg
Stage 4 Grade: 0 %
Stage 5 DBP: 71 mmHg
Stage 5 HR: 88 {beats}/min
Stage 5 SBP: 177 mmHg
Stage 5 Speed: 0 mph
Stage 6 Grade: 0 %
Stage 6 HR: 75 {beats}/min
Stage 6 Speed: 0 mph
TID: 1.09

## 2015-01-25 LAB — BASIC METABOLIC PANEL
BUN: 19 mg/dL (ref 6–23)
CO2: 33 mEq/L — ABNORMAL HIGH (ref 19–32)
Calcium: 9.2 mg/dL (ref 8.4–10.5)
Chloride: 100 mEq/L (ref 96–112)
Creatinine, Ser: 0.81 mg/dL (ref 0.40–1.50)
GFR: 99.01 mL/min (ref >60.00)
GLUCOSE: 127 mg/dL — AB (ref 70–99)
Potassium: 4.3 mEq/L (ref 3.5–5.1)
Sodium: 136 mEq/L (ref 135–145)

## 2015-01-25 LAB — HEPATIC FUNCTION PANEL
ALT: 12 U/L (ref 0–53)
AST: 18 U/L (ref 0–37)
Albumin: 4.3 g/dL (ref 3.5–5.2)
Alkaline Phosphatase: 52 U/L (ref 39–117)
BILIRUBIN DIRECT: 0.2 mg/dL (ref 0.0–0.3)
BILIRUBIN TOTAL: 0.7 mg/dL (ref 0.2–1.2)
TOTAL PROTEIN: 7.5 g/dL (ref 6.0–8.3)

## 2015-01-25 LAB — LIPID PANEL
Cholesterol: 116 mg/dL (ref 0–200)
HDL: 37.9 mg/dL — AB (ref >39.00)
LDL Cholesterol: 63 mg/dL (ref 0–99)
NonHDL: 78.1
Total CHOL/HDL Ratio: 3
Triglycerides: 76 mg/dL (ref 0.0–149.0)
VLDL: 15.2 mg/dL (ref 0.0–40.0)

## 2015-01-25 MED ORDER — TECHNETIUM TC 99M SESTAMIBI GENERIC - CARDIOLITE
11.0000 | Freq: Once | INTRAVENOUS | Status: AC | PRN
  Administered 2015-01-25: 11 via INTRAVENOUS

## 2015-01-25 MED ORDER — REGADENOSON 0.4 MG/5ML IV SOLN
0.4000 mg | Freq: Once | INTRAVENOUS | Status: AC
  Administered 2015-01-25: 0.4 mg via INTRAVENOUS

## 2015-01-25 MED ORDER — TECHNETIUM TC 99M SESTAMIBI GENERIC - CARDIOLITE
33.0000 | Freq: Once | INTRAVENOUS | Status: AC | PRN
  Administered 2015-01-25: 33 via INTRAVENOUS

## 2015-02-07 DIAGNOSIS — L989 Disorder of the skin and subcutaneous tissue, unspecified: Secondary | ICD-10-CM | POA: Diagnosis not present

## 2015-02-07 DIAGNOSIS — L819 Disorder of pigmentation, unspecified: Secondary | ICD-10-CM | POA: Diagnosis not present

## 2015-02-07 DIAGNOSIS — L929 Granulomatous disorder of the skin and subcutaneous tissue, unspecified: Secondary | ICD-10-CM | POA: Diagnosis not present

## 2015-03-08 ENCOUNTER — Other Ambulatory Visit: Payer: Self-pay

## 2015-03-08 MED ORDER — AMLODIPINE BESYLATE 5 MG PO TABS
5.0000 mg | ORAL_TABLET | Freq: Every day | ORAL | Status: DC
Start: 2015-03-08 — End: 2016-06-15

## 2015-04-12 ENCOUNTER — Encounter: Payer: Self-pay | Admitting: Cardiology

## 2015-04-19 ENCOUNTER — Other Ambulatory Visit: Payer: Self-pay

## 2015-04-19 MED ORDER — NITROGLYCERIN 0.4 MG SL SUBL: 0.4000 mg | SUBLINGUAL_TABLET | SUBLINGUAL | Status: AC | PRN

## 2015-06-10 ENCOUNTER — Other Ambulatory Visit: Payer: Self-pay | Admitting: Cardiology

## 2015-06-13 ENCOUNTER — Other Ambulatory Visit: Payer: Self-pay | Admitting: Cardiology

## 2015-06-14 ENCOUNTER — Telehealth: Payer: Self-pay | Admitting: Cardiology

## 2015-06-14 NOTE — Telephone Encounter (Signed)
Was sent in 06/13/15 for #90 with 1 refill to [facility]

## 2015-06-14 NOTE — Telephone Encounter (Signed)
New message        STAT if patient is at the pharmacy , call can be transferred to refill team.   1. Which medications need to be refilled?  HCTZ 12.5mg  2. Which pharmacy/location is medication to be sent to? [facility] 3. Do they need a 30 day or 90 day supply? Want 90day

## 2015-07-11 ENCOUNTER — Ambulatory Visit
Admission: RE | Admit: 2015-07-11 | Discharge: 2015-07-11 | Disposition: A | Discharge status: Home / Self Care | Payer: Medicare Other | Source: Ambulatory Visit | Attending: Unknown Physician Specialty | Admitting: Unknown Physician Specialty

## 2015-07-11 ENCOUNTER — Other Ambulatory Visit: Payer: Self-pay | Admitting: Unknown Physician Specialty

## 2015-07-11 DIAGNOSIS — R059 Cough, unspecified: Secondary | ICD-10-CM

## 2015-07-11 DIAGNOSIS — R05 Cough: Secondary | ICD-10-CM

## 2015-07-11 DIAGNOSIS — I252 Old myocardial infarction: Secondary | ICD-10-CM | POA: Insufficient documentation

## 2015-07-11 DIAGNOSIS — E1151 Type 2 diabetes mellitus with diabetic peripheral angiopathy without gangrene: Secondary | ICD-10-CM | POA: Diagnosis not present

## 2015-07-11 DIAGNOSIS — R0602 Shortness of breath: Secondary | ICD-10-CM | POA: Diagnosis not present

## 2015-07-11 DIAGNOSIS — H903 Sensorineural hearing loss, bilateral: Secondary | ICD-10-CM | POA: Diagnosis not present

## 2015-07-11 DIAGNOSIS — Z955 Presence of coronary angioplasty implant and graft: Secondary | ICD-10-CM | POA: Insufficient documentation

## 2015-07-12 ENCOUNTER — Encounter: Payer: Self-pay | Admitting: Cardiovascular Disease

## 2015-07-12 ENCOUNTER — Ambulatory Visit (INDEPENDENT_AMBULATORY_CARE_PROVIDER_SITE_OTHER): Payer: Medicare Other | Admitting: Cardiovascular Disease

## 2015-07-12 VITALS — BP 136/86 | HR 82 | Ht 66.0 in | Wt 207.2 lb

## 2015-07-12 DIAGNOSIS — R0602 Shortness of breath: Secondary | ICD-10-CM | POA: Diagnosis not present

## 2015-07-12 DIAGNOSIS — I2510 Atherosclerotic heart disease of native coronary artery without angina pectoris: Secondary | ICD-10-CM | POA: Diagnosis not present

## 2015-07-12 DIAGNOSIS — Z01812 Encounter for preprocedural laboratory examination: Secondary | ICD-10-CM

## 2015-07-12 DIAGNOSIS — I25119 Atherosclerotic heart disease of native coronary artery with unspecified angina pectoris: Secondary | ICD-10-CM | POA: Diagnosis not present

## 2015-07-12 DIAGNOSIS — R079 Chest pain, unspecified: Secondary | ICD-10-CM

## 2015-07-12 DIAGNOSIS — E7800 Pure hypercholesterolemia, unspecified: Secondary | ICD-10-CM

## 2015-07-12 DIAGNOSIS — I10 Essential (primary) hypertension: Secondary | ICD-10-CM

## 2015-07-12 NOTE — Patient Instructions (Addendum)
Medication Instructions:  Your physician recommends that you continue on your current medications as directed. Please refer to the Current Medication list given to you today.   Labwork: BMET, CBC, PT/INR  Testing/Procedures: Your physician has requested that you have a cardiac catheterization. Cardiac catheterization is used to diagnose and/or treat various heart conditions. Doctors may recommend this procedure for a number of different reasons. The most common reason is to evaluate chest pain. Chest pain can be a symptom of coronary artery disease (CAD), and cardiac catheterization can show whether plaque is narrowing or blocking your heart's arteries. This procedure is also used to evaluate the valves, as well as measure the blood flow and oxygen levels in different parts of your heart. For further information please visit [facility]iest[facility]tn. Please follow instruction sheet, as given.  [facility] Short Stay Entrance [facility] Cardiac Cath Instructions   You are scheduled for a Cardiac Cath on: Wednesday, November 16, 10am  Please arrive at  8am on the day of your procedure  Do not eat/drink anything after midnight  Someone will need to drive you home  It is recommended someone be with you for the first 24 hours after your procedure  Wear clothes that are easy to get on/off and wear slip on shoes if possible   Medications bring a current list of all medications with you    _xx__ Do not take these medications before your procedure: Do not take actoplus-metformin 24 hours before and 48 hours after your procedure. Do not take glipizide 24 hours before your procedure.   Day of your procedure:  If you have any questions, please call our office at [phone]  Follow-Up: Your physician recommends that you schedule a follow-up appointment in: three weeks with Dr. Fletcher Anon.    Any Other Special Instructions Will Be Listed Below (If  Applicable).     If you need a refill on your cardiac medications before your next appointment, please call your pharmacy.  Angiogram An angiogram, also called angiography, is a procedure used to look at the blood vessels. In this procedure, dye is injected through a long, thin tube (catheter) into an artery. X-rays are then taken. The X-rays will show if there is a blockage or problem in a blood vessel.  LET [facility] C[facility] PROVIDER KNOW ABOUT:  Any allergies you have, including allergies to shellfish or contrast dye.   All medicines you are taking, including vitamins, herbs, eye drops, creams, and over-the-counter medicines.   Previous problems you or members of your family have had with the use of anesthetics.   Any blood disorders you have.   Previous surgeries you have had.  Any previous kidney problems or failure you have had.  Medical conditions you have.   Possibility of pregnancy, if this applies. RISKS AND COMPLICATIONS Generally, an angiogram is a safe procedure. However, as with any procedure, problems can occur. Possible problems include:  Injury to the blood vessels, including rupture or bleeding.  Infection or bruising at the catheter site.  Allergic reaction to the dye or contrast used.  Kidney damage from the dye or contrast used.  Blood clots that can lead to a stroke or heart attack. BEFORE THE PROCEDURE  Do not eat or drink after midnight on the night before the procedure, or as directed by your health care provider.   Ask your health care provider if you may drink enough water to take any needed medicines the morning  of the procedure.  PROCEDURE  You may be given a medicine to help you relax (sedative) before and during the procedure. This medicine is given through an IV access tube that is inserted into one of your veins.   The area where the catheter will be inserted will be washed and shaved. This is usually done in the groin but may be  done in the fold of your arm (near your elbow) or in the wrist.  A medicine will be given to numb the area where the catheter will be inserted (local anesthetic).  The catheter will be inserted with a guide wire into an artery. The catheter is guided by using a type of X-ray (fluoroscopy) to the blood vessel being examined.   Dye is then injected into the catheter, and X-rays are taken. The dye helps to show where any narrowing or blockages are located.  AFTER THE PROCEDURE   If the procedure is done through the leg, you will be kept in bed lying flat for several hours. You will be instructed to not bend or cross your legs.  The insertion site will be checked frequently.  The pulse in your feet or wrist will be checked frequently.  Additional blood tests, X-rays, and electrocardiography may be done.   You may need to stay in the hospital overnight for observation.    This information is not intended to replace advice given to you by your health care provider. Make sure you discuss any questions you have with your health care provider.   Document Released: 05/27/2005 Document Revised: 09/07/2014 Document Reviewed: 01/18/2013 Elsevier Interactive Patient Education 2016 [place] After Refer to this sheet in the next few weeks. These instructions provide you with information about caring for yourself after your procedure. Your health care provider may also give you more specific instructions. Your treatment has been planned according to current medical practices, but problems sometimes occur. Call your health care provider if you have any problems or questions after your procedure. WHAT TO EXPECT AFTER THE PROCEDURE After your procedure, it is typical to have the following:  Bruising at the catheter insertion site that usually fades within 1-2 weeks.  Blood collecting in the tissue (hematoma) that may be painful to the touch. It should usually decrease in size and  tenderness within 1-2 weeks. HOME CARE INSTRUCTIONS  Take medicines only as directed by your health care provider.  You may shower 24-48 hours after the procedure or as directed by your health care provider. Remove the bandage (dressing) and gently wash the site with plain soap and water. Pat the area dry with a clean towel. Do not rub the site, because this may cause bleeding.  Do not take baths, swim, or use a hot tub until your health care provider approves.  Check your insertion site every day for redness, swelling, or drainage.  Do not apply powder or lotion to the site.  Do not lift over 10 lb (4.5 kg) for 5 days after your procedure or as directed by your health care provider.  Ask your health care provider when it is okay to:  Return to work or school.  Resume usual physical activities or sports.  Resume sexual activity.  Do not drive home if you are discharged the same day as the procedure. Have someone else drive you.  You may drive 24 hours after the procedure unless otherwise instructed by your health care provider.  Do not operate machinery or power tools for  24 hours after the procedure or as directed by your health care provider.  If your procedure was done as an outpatient procedure, which means that you went home the same day as your procedure, a responsible adult should be with you for the first 24 hours after you arrive home.  Keep all follow-up visits as directed by your health care provider. This is important. SEEK MEDICAL CARE IF:  You have a fever.  You have chills.  You have increased bleeding from the catheter insertion site. Hold pressure on the site. SEEK IMMEDIATE MEDICAL CARE IF:  You have unusual pain at the catheter insertion site.  You have redness, warmth, or swelling at the catheter insertion site.  You have drainage (other than a small amount of blood on the dressing) from the catheter insertion site.  The catheter insertion site is  bleeding, and the bleeding does not stop after 30 minutes of holding steady pressure on the site.  The area near or just beyond the catheter insertion site becomes pale, cool, tingly, or numb.   This information is not intended to replace advice given to you by your health care provider. Make sure you discuss any questions you have with your health care provider.   Document Released: 03/05/2005 Document Revised: 09/07/2014 Document Reviewed: 01/18/2013 Elsevier Interactive Patient Education [employer].

## 2015-07-14 ENCOUNTER — Encounter: Payer: Self-pay | Admitting: Cardiovascular Disease

## 2015-07-14 DIAGNOSIS — I25119 Atherosclerotic heart disease of native coronary artery with unspecified angina pectoris: Secondary | ICD-10-CM | POA: Insufficient documentation

## 2015-07-14 NOTE — Assessment & Plan Note (Signed)
Lab Results  Component Value Date   CHOL 116 01/25/2015   HDL 37.90* 01/25/2015   LDLCALC 63 01/25/2015   TRIG 76.0 01/25/2015   CHOLHDL 3 01/25/2015   Continue treatment with rosuvastatin and Zetia.

## 2015-07-14 NOTE — Assessment & Plan Note (Addendum)
The patient reports dyspnea with minimal activities which has been progressive and currently happening with minimal activities. He feels that these are similar symptoms to his prior angina. He has minimal chest discomfort but he is also diabetic and might not have typical angina. He did have a stress test back in May which was unremarkable. However, this does not exclude underlying obstructive coronary artery disease especially with the severity of the patient's symptoms. I think the best option is to proceed with a right and left cardiac catheterization for a definitive answer. If cardiac evaluation is nonrevealing, the next step would be pulmonary evaluation especially with his abnormal chest x-ray. I discussed The catheterization and details as well as risks and benefits. If PCI is needed, we will use an alternative to Plavix as he is allergic to.

## 2015-07-14 NOTE — Progress Notes (Signed)
HPI  Adam [place] is a 74 y.o. male with a history of ASCAD s/p PCI of the mid LAD 8/09 and then platinumm  stent to the mid-distal RCA complicated by NSTEMI from jailed  RV marginal and then PCI of the distal RCA 10/11 with BMS overlapped with the prior stent. He also has a history of dyslipidemia, type 2 diabetes and HTN.  He is normally followed by Dr. Golden Hurter and before that Dr. Ilda Foil. The patient was referred by Dr. Tami Ribas from ENT for a second opinion regarding continued shortness of breath. The patient reports having pneumonia in December 2015 and since then he reports that he has not recovered at all. He is now complaining of dyspnea with minimal activities which is very unusual for him because he is usually very active. He is having hard time going up one flight of stairs. He reports that these symptoms are similar to his prior angina. He feels very limited and he is very frustrated. He quit smoking 30 years ago and is not aware of history of COPD. He has no history of DVT or pulmonary embolism. He underwent a nuclear stress test in May of this year which showed no evidence of ischemia with normal ejection fraction. He did have a recent chest x-ray done which showed bilateral hypoinflation with increased pulmonary interstitial markings which could reflect edema or atypical pneumonia.   Allergies  Allergen Reactions  . Plavix [Clopidogrel Bisulfate] Hives     Current Outpatient Prescriptions on File Prior to Visit  Medication Sig Dispense Refill  . amLODipine (NORVASC) 5 MG tablet Take 1 tablet (5 mg total) by mouth daily. 90 tablet 3  . aspirin 81 MG tablet Take 1 tablet (81 mg total) by mouth daily.    Marland Kitchen glipiZIDE (GLUCOTROL XL) 10 MG 24 hr tablet Take 10 mg by mouth daily with breakfast.     . hydrochlorothiazide (MICROZIDE) 12.5 MG capsule TAKE ONE CAPSULE BY MOUTH ONCE DAILY 90 capsule 1  . metoprolol succinate (TOPROL-XL) 100 MG 24 hr tablet Take 1 tablet (100 mg  total) by mouth daily. Take with or immediately following a meal. 30 tablet 6  . nitroGLYCERIN (NITROSTAT) 0.4 MG SL tablet Place 1 tablet (0.4 mg total) under the tongue every 5 (five) minutes as needed for chest pain. 25 tablet 3  . pioglitazone-metformin (ACTOPLUS MET) 15-500 MG per tablet Take 1 tablet by mouth daily.    . rosuvastatin (CRESTOR) 40 MG tablet Take 1 tablet (40 mg total) by mouth daily. 30 tablet 6  . ZETIA 10 MG tablet TAKE ONE TABLET BY MOUTH ONCE DAILY 90 tablet 0   No current facility-administered medications on file prior to visit.     Past Medical History  Diagnosis Date  . History of ASCVD 04/18/08    2 vessel. s/p drug-elutign stent, mid LAD  . NSTEMI (non-ST elevated myocardial infarction) ([place])     occluded RV marginal (stent jail)  . Angina pectoris ([place])   . Type II or unspecified type diabetes mellitus without mention of complication, not stated as uncontrolled   . Prostate carcinoma ([place])   . Diverticulosis   . Encounter for long-term (current) use of other medications   . Essential hypertension, benign   . Pure hypercholesterolemia   . PVD (peripheral vascular disease) ([facility])     abnormal LE arterial dopplers with mild nonobstructive disease throughout RLE, moderate mid left SFA stenosis, normal resting ABI's  . Coronary atherosclerosis of native coronary artery  s/p PCI of the mid LAD 2009, paltinum study stent RCA 1610 complicated by NSTEMI from occluded RV marginal from jailed stent, repeat cath 2011 PCI of distal RCA     Past Surgical History  Procedure Laterality Date  . Prostatectomy      total  . Tonsillectomy      remote  . Percutaneous coronary stent intervention (pci-s)  06/17/10  . Left heart cath  06/17/10  . Cardiac catheterization    . Coronary angioplasty       Family History  Problem Relation Age of Onset  . Alzheimer's disease Mother   . Heart attack Mother   . Heart disease Mother   . Cancer Father   . Heart disease  Father   . Heart attack Father   . Heart attack Brother   . Heart disease Brother   . Heart attack Brother   . Heart disease Brother   . Cancer Brother      Social History   Social History  . Marital Status: Married    Spouse Name: N/A  . Number of Children: N/A  . Years of Education: N/A   Occupational History  . Not on file.   Social History Main Topics  . Smoking status: Former Research scientist (life sciences)  . Smokeless tobacco: Not on file     Comment: quit 1989  . Alcohol Use: Yes     Comment: rare  . Drug Use: No  . Sexual Activity: Not on file   Other Topics Concern  . Not on file   Social History Narrative      PHYSICAL EXAM   BP 136/86 mmHg  Pulse 82  Ht _0  (1.676 m)  Wt 207 lb 4 oz (94.008 kg)  BMI 33.47 kg/m2 Constitutional: He is oriented to person, place, and time. He appears well-developed and well-nourished. No distress.  HENT: No nasal discharge.  Head: Normocephalic and atraumatic.  Eyes: Pupils are equal and round.  No discharge. Neck: Normal range of motion. Neck supple. No JVD present. No thyromegaly present.  Cardiovascular: Normal rate, regular rhythm, normal heart sounds. Exam reveals no gallop and no friction rub. No murmur heard.  Pulmonary/Chest: Effort normal and diminished breath sounds . No stridor. No respiratory distress. He has no wheezes. He has no rales. He exhibits no tenderness.  Abdominal: Soft. Bowel sounds are normal. He exhibits no distension. There is no tenderness. There is no rebound and no guarding.  Musculoskeletal: Normal range of motion. He exhibits no edema and no tenderness.  Neurological: He is alert and oriented to person, place, and time. Coordination normal.  Skin: Skin is warm and dry. No rash noted. He is not diaphoretic. No erythema. No pallor.  Psychiatric: He has a normal mood and affect. His behavior is normal. Judgment and thought content normal.       EKG: Normal sinus rhythm with minimal LVH.   ASSESSMENT AND  PLAN

## 2015-07-14 NOTE — Assessment & Plan Note (Signed)
Blood pressure is controlled on current medications. 

## 2015-07-15 ENCOUNTER — Other Ambulatory Visit
Admission: RE | Admit: 2015-07-15 | Discharge: 2015-07-15 | Disposition: A | Discharge status: Home / Self Care | Payer: Medicare Other | Source: Ambulatory Visit | Attending: Cardiovascular Disease | Admitting: Cardiovascular Disease

## 2015-07-15 ENCOUNTER — Other Ambulatory Visit: Payer: Self-pay

## 2015-07-15 ENCOUNTER — Telehealth: Payer: Self-pay

## 2015-07-15 DIAGNOSIS — J849 Interstitial pulmonary disease, unspecified: Secondary | ICD-10-CM | POA: Insufficient documentation

## 2015-07-15 DIAGNOSIS — R0902 Hypoxemia: Secondary | ICD-10-CM | POA: Diagnosis not present

## 2015-07-15 DIAGNOSIS — Z01812 Encounter for preprocedural laboratory examination: Secondary | ICD-10-CM

## 2015-07-15 DIAGNOSIS — R0600 Dyspnea, unspecified: Secondary | ICD-10-CM | POA: Insufficient documentation

## 2015-07-15 LAB — CBC
HCT: 44.5 % (ref 40.0–52.0)
HEMOGLOBIN: 14.8 g/dL (ref 13.0–18.0)
MCH: 28.2 pg (ref 26.0–34.0)
MCHC: 33.2 g/dL (ref 32.0–36.0)
MCV: 84.8 fL (ref 80.0–100.0)
Platelets: 253 10*3/uL (ref 150–440)
RBC: 5.25 MIL/uL (ref 4.40–5.90)
RDW: 13.6 % (ref 11.5–14.5)
WBC: 10 10*3/uL (ref 3.8–10.6)

## 2015-07-15 LAB — PROTIME-INR
INR: 1.03
PROTHROMBIN TIME: 13.7 s (ref 11.4–15.0)

## 2015-07-15 NOTE — Telephone Encounter (Signed)
S/w pt regarding lab re-draw due to labs not being sent out from our office on Friday. Pt states he has appt in [place] today and will have labs redrawn at the hospital. Orders placed

## 2015-07-16 ENCOUNTER — Telehealth: Payer: Self-pay

## 2015-07-16 NOTE — Telephone Encounter (Signed)
Called to review cath instructions. No answer. Left message on machine for patient to contact the office.

## 2015-07-16 NOTE — Telephone Encounter (Signed)
Pt had labs at [facility] CBC and PT/INR drawn but not the BMET. S/w Santiago Glad at [facility] lab to inform her of need for BMET. Per Santiago Glad, pt can have labs drawn tomorrow morning once arrived.

## 2015-07-17 ENCOUNTER — Ambulatory Visit (HOSPITAL_COMMUNITY)
Admission: RE | Admit: 2015-07-17 | Discharge: 2015-07-17 | Disposition: A | Discharge status: Home / Self Care | Payer: Medicare Other | Source: Ambulatory Visit | Attending: Cardiovascular Disease | Admitting: Cardiovascular Disease

## 2015-07-17 ENCOUNTER — Other Ambulatory Visit: Payer: Self-pay

## 2015-07-17 ENCOUNTER — Encounter (HOSPITAL_COMMUNITY)
Admission: RE | Disposition: A | Discharge status: Home / Self Care | Payer: Self-pay | Source: Ambulatory Visit | Attending: Cardiovascular Disease

## 2015-07-17 DIAGNOSIS — Z7984 Long term (current) use of oral hypoglycemic drugs: Secondary | ICD-10-CM | POA: Diagnosis not present

## 2015-07-17 DIAGNOSIS — R0602 Shortness of breath: Secondary | ICD-10-CM

## 2015-07-17 DIAGNOSIS — I25118 Atherosclerotic heart disease of native coronary artery with other forms of angina pectoris: Secondary | ICD-10-CM | POA: Insufficient documentation

## 2015-07-17 DIAGNOSIS — Z8546 Personal history of malignant neoplasm of prostate: Secondary | ICD-10-CM | POA: Diagnosis not present

## 2015-07-17 DIAGNOSIS — I272 Other secondary pulmonary hypertension: Secondary | ICD-10-CM

## 2015-07-17 DIAGNOSIS — I10 Essential (primary) hypertension: Secondary | ICD-10-CM | POA: Diagnosis not present

## 2015-07-17 DIAGNOSIS — I252 Old myocardial infarction: Secondary | ICD-10-CM | POA: Insufficient documentation

## 2015-07-17 DIAGNOSIS — I25119 Atherosclerotic heart disease of native coronary artery with unspecified angina pectoris: Secondary | ICD-10-CM | POA: Diagnosis not present

## 2015-07-17 DIAGNOSIS — Z7982 Long term (current) use of aspirin: Secondary | ICD-10-CM | POA: Insufficient documentation

## 2015-07-17 DIAGNOSIS — E119 Type 2 diabetes mellitus without complications: Secondary | ICD-10-CM | POA: Insufficient documentation

## 2015-07-17 DIAGNOSIS — E7800 Pure hypercholesterolemia, unspecified: Secondary | ICD-10-CM | POA: Insufficient documentation

## 2015-07-17 DIAGNOSIS — Z79899 Other long term (current) drug therapy: Secondary | ICD-10-CM | POA: Diagnosis not present

## 2015-07-17 DIAGNOSIS — Z955 Presence of coronary angioplasty implant and graft: Secondary | ICD-10-CM | POA: Insufficient documentation

## 2015-07-17 DIAGNOSIS — Z87891 Personal history of nicotine dependence: Secondary | ICD-10-CM | POA: Insufficient documentation

## 2015-07-17 HISTORY — PX: CARDIAC CATHETERIZATION: SHX172

## 2015-07-17 LAB — POCT I-STAT 3, ART BLOOD GAS (G3+)
Acid-Base Excess: 2 mmol/L (ref 0.0–2.0)
Bicarbonate: 27.9 mEq/L — ABNORMAL HIGH (ref 20.0–24.0)
O2 Saturation: 96 %
PCO2 ART: 48.8 mmHg — AB (ref 35.0–45.0)
PH ART: 7.366 (ref 7.350–7.450)
PO2 ART: 84 mmHg (ref 80.0–100.0)
TCO2: 29 mmol/L (ref 0–100)

## 2015-07-17 LAB — POCT I-STAT 3, VENOUS BLOOD GAS (G3P V)
Acid-Base Excess: 1 mmol/L (ref 0.0–2.0)
Bicarbonate: 27.8 mEq/L — ABNORMAL HIGH (ref 20.0–24.0)
O2 Saturation: 68 %
PH VEN: 7.343 — AB (ref 7.250–7.300)
TCO2: 29 mmol/L (ref 0–100)
pCO2, Ven: 51.1 mmHg — ABNORMAL HIGH (ref 45.0–50.0)
pO2, Ven: 38 mmHg (ref 30.0–45.0)

## 2015-07-17 LAB — BASIC METABOLIC PANEL
ANION GAP: 9 (ref 5–15)
BUN: 12 mg/dL (ref 6–20)
CALCIUM: 9.2 mg/dL (ref 8.9–10.3)
CO2: 28 mmol/L (ref 22–32)
CREATININE: 0.84 mg/dL (ref 0.61–1.24)
Chloride: 102 mmol/L (ref 101–111)
Glucose, Bld: 187 mg/dL — ABNORMAL HIGH (ref 65–99)
Potassium: 4.1 mmol/L (ref 3.5–5.1)
Sodium: 139 mmol/L (ref 135–145)

## 2015-07-17 LAB — GLUCOSE, CAPILLARY
GLUCOSE-CAPILLARY: 119 mg/dL — AB (ref 65–99)
GLUCOSE-CAPILLARY: 103 mg/dL — AB (ref 65–99)
Glucose-Capillary: 177 mg/dL — ABNORMAL HIGH (ref 65–99)

## 2015-07-17 LAB — POCT ACTIVATED CLOTTING TIME
ACTIVATED CLOTTING TIME: 202 s
Activated Clotting Time: 159 seconds

## 2015-07-17 SURGERY — RIGHT/LEFT HEART CATH AND CORONARY ANGIOGRAPHY
Anesthesia: LOCAL

## 2015-07-17 MED ORDER — LIDOCAINE HCL (PF) 1 % IJ SOLN
INTRAMUSCULAR | Status: AC
  Filled 2015-07-17: qty 30

## 2015-07-17 MED ORDER — SODIUM CHLORIDE 0.9 % IJ SOLN: 3.0000 mL | Freq: Two times a day (BID) | INTRAMUSCULAR | Status: DC

## 2015-07-17 MED ORDER — HEPARIN (PORCINE) IN NACL 2-0.9 UNIT/ML-% IJ SOLN
INTRAMUSCULAR | Status: AC
  Filled 2015-07-17: qty 1500

## 2015-07-17 MED ORDER — SODIUM CHLORIDE 0.9 % IJ SOLN: 3.0000 mL | INTRAMUSCULAR | Status: DC | PRN

## 2015-07-17 MED ORDER — IOHEXOL 350 MG/ML SOLN
INTRAVENOUS | Status: DC | PRN
  Administered 2015-07-17: 85 mL via INTRAVENOUS

## 2015-07-17 MED ORDER — ASPIRIN 81 MG PO CHEW
CHEWABLE_TABLET | ORAL | Status: AC
  Administered 2015-07-17: 81 mg via ORAL
  Filled 2015-07-17: qty 1

## 2015-07-17 MED ORDER — ASPIRIN 81 MG PO CHEW
81.0000 mg | CHEWABLE_TABLET | ORAL | Status: AC
  Administered 2015-07-17: 81 mg via ORAL

## 2015-07-17 MED ORDER — VERAPAMIL HCL 2.5 MG/ML IV SOLN
INTRAVENOUS | Status: AC
  Filled 2015-07-17: qty 2

## 2015-07-17 MED ORDER — MIDAZOLAM HCL 2 MG/2ML IJ SOLN
INTRAMUSCULAR | Status: DC | PRN
  Administered 2015-07-17: 1 mg via INTRAVENOUS

## 2015-07-17 MED ORDER — SODIUM CHLORIDE 0.9 % WEIGHT BASED INFUSION: 1.0000 mL/kg/h | INTRAVENOUS | Status: DC

## 2015-07-17 MED ORDER — FENTANYL CITRATE (PF) 100 MCG/2ML IJ SOLN
INTRAMUSCULAR | Status: DC | PRN
  Administered 2015-07-17: 25 ug via INTRAVENOUS

## 2015-07-17 MED ORDER — SODIUM CHLORIDE 0.9 % IV SOLN: 250.0000 mL | INTRAVENOUS | Status: DC | PRN

## 2015-07-17 MED ORDER — LIDOCAINE HCL (PF) 1 % IJ SOLN
INTRAMUSCULAR | Status: DC | PRN
  Administered 2015-07-17: 20 mL

## 2015-07-17 MED ORDER — HEPARIN SODIUM (PORCINE) 1000 UNIT/ML IJ SOLN
INTRAMUSCULAR | Status: DC | PRN
  Administered 2015-07-17: 5000 [IU] via INTRAVENOUS

## 2015-07-17 MED ORDER — HEPARIN SODIUM (PORCINE) 1000 UNIT/ML IJ SOLN
INTRAMUSCULAR | Status: AC
  Filled 2015-07-17: qty 1

## 2015-07-17 MED ORDER — FENTANYL CITRATE (PF) 100 MCG/2ML IJ SOLN
INTRAMUSCULAR | Status: AC
  Filled 2015-07-17: qty 2

## 2015-07-17 MED ORDER — VERAPAMIL HCL 2.5 MG/ML IV SOLN
INTRAVENOUS | Status: DC | PRN
  Administered 2015-07-17: 12:00:00 via INTRA_ARTERIAL

## 2015-07-17 MED ORDER — SODIUM CHLORIDE 0.9 % WEIGHT BASED INFUSION
3.0000 mL/kg/h | INTRAVENOUS | Status: DC
  Administered 2015-07-17: 3 mL/kg/h via INTRAVENOUS

## 2015-07-17 MED ORDER — MIDAZOLAM HCL 2 MG/2ML IJ SOLN
INTRAMUSCULAR | Status: AC
  Filled 2015-07-17: qty 2

## 2015-07-17 MED ORDER — LIDOCAINE HCL (PF) 1 % IJ SOLN
INTRAMUSCULAR | Status: DC | PRN
  Administered 2015-07-17: 2 mL via INTRADERMAL
  Administered 2015-07-17: 15 mL via INTRADERMAL

## 2015-07-17 SURGICAL SUPPLY — 15 items
CATH INFINITI 5FR ANG PIGTAIL (CATHETERS) ×2 IMPLANT
CATH OPTITORQUE JACKY 4.0 5F (CATHETERS) ×2 IMPLANT
CATH SWAN GANZ 7F STRAIGHT (CATHETERS) ×1 IMPLANT
DEVICE RAD COMP TR BAND LRG (VASCULAR PRODUCTS) ×2 IMPLANT
GLIDESHEATH SLEND SS 6F .021 (SHEATH) ×2 IMPLANT
KIT HEART LEFT (KITS) ×2 IMPLANT
KIT HEART RIGHT NAMIC (KITS) ×2 IMPLANT
PACK CARDIAC CATHETERIZATION (CUSTOM PROCEDURE TRAY) ×2 IMPLANT
SHEATH PINNACLE 7F 10CM (SHEATH) ×1 IMPLANT
SYR MEDRAD MARK V 150ML (SYRINGE) ×2 IMPLANT
TRANSDUCER W/STOPCOCK (MISCELLANEOUS) ×2 IMPLANT
TUBING CIL FLEX 10 FLL-RA (TUBING) ×2 IMPLANT
WIRE EMERALD 3MM-J .025X260CM (WIRE) ×1 IMPLANT
WIRE HI TORQ VERSACORE-J 145CM (WIRE) ×1 IMPLANT
WIRE SAFE-T 1.5MM-J .035X260CM (WIRE) ×2 IMPLANT

## 2015-07-17 NOTE — H&P (View-Only) (Signed)
 HPI  Adam [place] is a 74 y.o. male with a history of ASCAD s/p PCI of the mid LAD 8/09 and then platinumm  stent to the mid-distal RCA complicated by NSTEMI from jailed  RV marginal and then PCI of the distal RCA 10/11 with BMS overlapped with the prior stent. He also has a history of dyslipidemia, type 2 diabetes and HTN.  He is normally followed by Dr. Tracy Turner and before that Dr. Edmonds. The patient was referred by Dr. McQueen from ENT for a second opinion regarding continued shortness of breath. The patient reports having pneumonia in December 2015 and since then he reports that he has not recovered at all. He is now complaining of dyspnea with minimal activities which is very unusual for him because he is usually very active. He is having hard time going up one flight of stairs. He reports that these symptoms are similar to his prior angina. He feels very limited and he is very frustrated. He quit smoking 30 years ago and is not aware of history of COPD. He has no history of DVT or pulmonary embolism. He underwent a nuclear stress test in May of this year which showed no evidence of ischemia with normal ejection fraction. He did have a recent chest x-ray done which showed bilateral hypoinflation with increased pulmonary interstitial markings which could reflect edema or atypical pneumonia.   Allergies  Allergen Reactions  . Plavix [Clopidogrel Bisulfate] Hives     Current Outpatient Prescriptions on File Prior to Visit  Medication Sig Dispense Refill  . amLODipine (NORVASC) 5 MG tablet Take 1 tablet (5 mg total) by mouth daily. 90 tablet 3  . aspirin 81 MG tablet Take 1 tablet (81 mg total) by mouth daily.    . glipiZIDE (GLUCOTROL XL) 10 MG 24 hr tablet Take 10 mg by mouth daily with breakfast.     . hydrochlorothiazide (MICROZIDE) 12.5 MG capsule TAKE ONE CAPSULE BY MOUTH ONCE DAILY 90 capsule 1  . metoprolol succinate (TOPROL-XL) 100 MG 24 hr tablet Take 1 tablet (100 mg  total) by mouth daily. Take with or immediately following a meal. 30 tablet 6  . nitroGLYCERIN (NITROSTAT) 0.4 MG SL tablet Place 1 tablet (0.4 mg total) under the tongue every 5 (five) minutes as needed for chest pain. 25 tablet 3  . pioglitazone-metformin (ACTOPLUS MET) 15-500 MG per tablet Take 1 tablet by mouth daily.    . rosuvastatin (CRESTOR) 40 MG tablet Take 1 tablet (40 mg total) by mouth daily. 30 tablet 6  . ZETIA 10 MG tablet TAKE ONE TABLET BY MOUTH ONCE DAILY 90 tablet 0   No current facility-administered medications on file prior to visit.     Past Medical History  Diagnosis Date  . History of ASCVD 04/18/08    2 vessel. s/p drug-elutign stent, mid LAD  . NSTEMI (non-ST elevated myocardial infarction) ([facility])     occluded RV marginal (stent jail)  . Angina pectoris ([facility])   . Type II or unspecified type diabetes mellitus without mention of complication, not stated as uncontrolled   . Prostate carcinoma ([facility])   . Diverticulosis   . Encounter for long-term (current) use of other medications   . Essential hypertension, benign   . Pure hypercholesterolemia   . PVD (peripheral vascular disease) ([facility])     abnormal LE arterial dopplers with mild nonobstructive disease throughout RLE, moderate mid left SFA stenosis, normal resting ABI's  . Coronary atherosclerosis of native coronary artery       s/p PCI of the mid LAD 2009, paltinum study stent RCA 2009 complicated by NSTEMI from occluded RV marginal from jailed stent, repeat cath 2011 PCI of distal RCA     Past Surgical History  Procedure Laterality Date  . Prostatectomy      total  . Tonsillectomy      remote  . Percutaneous coronary stent intervention (pci-s)  06/17/10  . Left heart cath  06/17/10  . Cardiac catheterization    . Coronary angioplasty       Family History  Problem Relation Age of Onset  . Alzheimer's disease Mother   . Heart attack Mother   . Heart disease Mother   . Cancer Father   . Heart disease  Father   . Heart attack Father   . Heart attack Brother   . Heart disease Brother   . Heart attack Brother   . Heart disease Brother   . Cancer Brother      Social History   Social History  . Marital Status: Married    Spouse Name: N/A  . Number of Children: N/A  . Years of Education: N/A   Occupational History  . Not on file.   Social History Main Topics  . Smoking status: Former Smoker  . Smokeless tobacco: Not on file     Comment: quit 1989  . Alcohol Use: Yes     Comment: rare  . Drug Use: No  . Sexual Activity: Not on file   Other Topics Concern  . Not on file   Social History Narrative      PHYSICAL EXAM   BP 136/86 mmHg  Pulse 82  Ht 5' 6" (1.676 m)  Wt 207 lb 4 oz (94.008 kg)  BMI 33.47 kg/m2 Constitutional: He is oriented to person, place, and time. He appears well-developed and well-nourished. No distress.  HENT: No nasal discharge.  Head: Normocephalic and atraumatic.  Eyes: Pupils are equal and round.  No discharge. Neck: Normal range of motion. Neck supple. No JVD present. No thyromegaly present.  Cardiovascular: Normal rate, regular rhythm, normal heart sounds. Exam reveals no gallop and no friction rub. No murmur heard.  Pulmonary/Chest: Effort normal and diminished breath sounds . No stridor. No respiratory distress. He has no wheezes. He has no rales. He exhibits no tenderness.  Abdominal: Soft. Bowel sounds are normal. He exhibits no distension. There is no tenderness. There is no rebound and no guarding.  Musculoskeletal: Normal range of motion. He exhibits no edema and no tenderness.  Neurological: He is alert and oriented to person, place, and time. Coordination normal.  Skin: Skin is warm and dry. No rash noted. He is not diaphoretic. No erythema. No pallor.  Psychiatric: He has a normal mood and affect. His behavior is normal. Judgment and thought content normal.       EKG: Normal sinus rhythm with minimal LVH.   ASSESSMENT AND  PLAN   

## 2015-07-17 NOTE — Discharge Instructions (Signed)
Radial Site Care Refer to this sheet in the next few weeks. These instructions provide you with information about caring for yourself after your procedure. Your health care provider may also give you more specific instructions. Your treatment has been planned according to current medical practices, but problems sometimes occur. Call your health care provider if you have any problems or questions after your procedure. WHAT TO EXPECT AFTER THE PROCEDURE After your procedure, it is typical to have the following:  Bruising at the radial site that usually fades within 1-2 weeks.  Blood collecting in the tissue (hematoma) that may be painful to the touch. It should usually decrease in size and tenderness within 1-2 weeks. HOME CARE INSTRUCTIONS  Take medicines only as directed by your health care provider.  You may shower 24-48 hours after the procedure or as directed by your health care provider. Remove the bandage (dressing) and gently wash the site with plain soap and water. Pat the area dry with a clean towel. Do not rub the site, because this may cause bleeding.  Do not take baths, swim, or use a hot tub until your health care provider approves.  Check your insertion site every day for redness, swelling, or drainage.  Do not apply powder or lotion to the site.  Do not flex or bend the affected arm for 24 hours or as directed by your health care provider.  Do not push or pull heavy objects with the affected arm for 24 hours or as directed by your health care provider.  Do not lift over 10 lb (4.5 kg) for 5 days after your procedure or as directed by your health care provider.  Ask your health care provider when it is okay to:  Return to work or school.  Resume usual physical activities or sports.  Resume sexual activity.  Do not drive home if you are discharged the same day as the procedure. Have someone else drive you.  You may drive 24 hours after the procedure unless otherwise  instructed by your health care provider.  Do not operate machinery or power tools for 24 hours after the procedure.  If your procedure was done as an outpatient procedure, which means that you went home the same day as your procedure, a responsible adult should be with you for the first 24 hours after you arrive home.  Keep all follow-up visits as directed by your health care provider. This is important. SEEK MEDICAL CARE IF:  You have a fever.  You have chills.  You have increased bleeding from the radial site. Hold pressure on the site and call 911. SEEK IMMEDIATE MEDICAL CARE IF:  You have unusual pain at the radial site.  You have redness, warmth, or swelling at the radial site.  You have drainage (other than a small amount of blood on the dressing) from the radial site.  The radial site is bleeding, and the bleeding does not stop after 30 minutes of holding steady pressure on the site.  Your arm or hand becomes pale, cool, tingly, or numb.   This information is not intended to replace advice given to you by your health care provider. Make sure you discuss any questions you have with your health care provider.   Document Released: 09/19/2010 Document Revised: 09/07/2014 Document Reviewed: 03/05/2014 Elsevier Interactive Patient Education 2016 [place] After Refer to this sheet in the next few weeks. These instructions provide you with information about caring for yourself  after your procedure. Your health care provider may also give you more specific instructions. Your treatment has been planned according to current medical practices, but problems sometimes occur. Call your health care provider if you have any problems or questions after your procedure. WHAT TO EXPECT AFTER THE PROCEDURE After your procedure, it is typical to have the following:  Bruising at the catheter insertion site that usually fades within 1-2 weeks.  Blood collecting in the  tissue (hematoma) that may be painful to the touch. It should usually decrease in size and tenderness within 1-2 weeks. HOME CARE INSTRUCTIONS  Take medicines only as directed by your health care provider.  You may shower 24-48 hours after the procedure or as directed by your health care provider. Remove the bandage (dressing) and gently wash the site with plain soap and water. Pat the area dry with a clean towel. Do not rub the site, because this may cause bleeding.  Do not take baths, swim, or use a hot tub until your health care provider approves.  Check your insertion site every day for redness, swelling, or drainage.  Do not apply powder or lotion to the site.  Do not lift over 10 lb (4.5 kg) for 5 days after your procedure or as directed by your health care provider.  Ask your health care provider when it is okay to:  Return to work or school.  Resume usual physical activities or sports.  Resume sexual activity.  Do not drive home if you are discharged the same day as the procedure. Have someone else drive you.  You may drive 24 hours after the procedure unless otherwise instructed by your health care provider.  Do not operate machinery or power tools for 24 hours after the procedure or as directed by your health care provider.  If your procedure was done as an outpatient procedure, which means that you went home the same day as your procedure, a responsible adult should be with you for the first 24 hours after you arrive home.  Keep all follow-up visits as directed by your health care provider. This is important. SEEK MEDICAL CARE IF:  You have a fever.  You have chills.  You have increased bleeding from the catheter insertion site. Hold pressure on the site. SEEK IMMEDIATE MEDICAL CARE IF:  You have unusual pain at the catheter insertion site.  You have redness, warmth, or swelling at the catheter insertion site.  You have drainage (other than a small amount of  blood on the dressing) from the catheter insertion site.  The catheter insertion site is bleeding, and the bleeding does not stop after 30 minutes of holding steady pressure on the site.  The area near or just beyond the catheter insertion site becomes pale, cool, tingly, or numb.   This information is not intended to replace advice given to you by your health care provider. Make sure you discuss any questions you have with your health care provider.   Document Released: 03/05/2005 Document Revised: 09/07/2014 Document Reviewed: 01/18/2013 Elsevier Interactive Patient Education [employer].

## 2015-07-17 NOTE — Interval H&P Note (Signed)
Cath Lab Visit (complete for each Cath Lab visit)  Clinical Evaluation Leading to the Procedure:   ACS: No.  Non-ACS:    Anginal Classification: CCS III  Anti-ischemic medical therapy: Maximal Therapy (2 or more classes of medications)  Non-Invasive Test Results: No non-invasive testing performed  Prior CABG: No previous CABG      History and Physical Interval Note:  07/17/2015 11:15 AM  [place]  has presented today for surgery, with the diagnosis of cp  The various methods of treatment have been discussed with the patient and family. After consideration of risks, benefits and other options for treatment, the patient has consented to  Procedure(s): Right/Left Heart Cath and Coronary Angiography (N/A) as a surgical intervention .  The patient's history has been reviewed, patient examined, no change in status, stable for surgery.  I have reviewed the patient's chart and labs.  Questions were answered to the patient's satisfaction.     Kathlyn [place]

## 2015-07-17 NOTE — Progress Notes (Signed)
7Fr right femoral venous sheath aspirated and pulled.  Pressure held for 68mins.  Site is level 0.  Gauze and tegaderm placed over site.  Instructions given to patient.  Pt tolerated well.

## 2015-07-18 ENCOUNTER — Encounter (HOSPITAL_COMMUNITY): Payer: Self-pay | Admitting: Cardiovascular Disease

## 2015-07-24 ENCOUNTER — Ambulatory Visit: Payer: Medicare Other | Admitting: Cardiology

## 2015-07-30 DIAGNOSIS — E1165 Type 2 diabetes mellitus with hyperglycemia: Secondary | ICD-10-CM | POA: Diagnosis not present

## 2015-07-30 DIAGNOSIS — Z79899 Other long term (current) drug therapy: Secondary | ICD-10-CM | POA: Diagnosis not present

## 2015-07-30 DIAGNOSIS — Z1389 Encounter for screening for other disorder: Secondary | ICD-10-CM | POA: Diagnosis not present

## 2015-07-30 DIAGNOSIS — C61 Malignant neoplasm of prostate: Secondary | ICD-10-CM | POA: Diagnosis not present

## 2015-07-30 DIAGNOSIS — R0602 Shortness of breath: Secondary | ICD-10-CM | POA: Diagnosis not present

## 2015-07-30 DIAGNOSIS — Z6832 Body mass index (BMI) 32.0-32.9, adult: Secondary | ICD-10-CM | POA: Diagnosis not present

## 2015-07-30 DIAGNOSIS — I2510 Atherosclerotic heart disease of native coronary artery without angina pectoris: Secondary | ICD-10-CM | POA: Diagnosis not present

## 2015-08-14 ENCOUNTER — Ambulatory Visit (INDEPENDENT_AMBULATORY_CARE_PROVIDER_SITE_OTHER): Payer: Medicare Other | Admitting: Pulmonary Disease

## 2015-08-14 ENCOUNTER — Encounter: Payer: Self-pay | Admitting: Pulmonary Disease

## 2015-08-14 VITALS — BP 150/82 | HR 107 | Ht 66.0 in | Wt 209.0 lb

## 2015-08-14 DIAGNOSIS — J849 Interstitial pulmonary disease, unspecified: Secondary | ICD-10-CM | POA: Diagnosis not present

## 2015-08-14 DIAGNOSIS — R0600 Dyspnea, unspecified: Secondary | ICD-10-CM | POA: Diagnosis not present

## 2015-08-14 DIAGNOSIS — I2510 Atherosclerotic heart disease of native coronary artery without angina pectoris: Secondary | ICD-10-CM

## 2015-08-14 DIAGNOSIS — R0902 Hypoxemia: Secondary | ICD-10-CM | POA: Diagnosis not present

## 2015-08-14 NOTE — Patient Instructions (Addendum)
You have interstitial lung disease (inflammation or scarring in the lungs) This is causing you to have low oxygen, especially with exertion I do not currently know the cause of this problem but it probably has been present for many months or longer and probably was what was called pneumonia last year The evaluation of this requires blood work today, a CT scan of your chest and lung function measurements Because of your low oxygen levels with exertion, I have prescribed oxygen for you to be worn as close to 24 hrs/day as possible  It is most important to wear it with sleep and exertion

## 2015-08-14 NOTE — Progress Notes (Signed)
PULMONARY CONSULT NOTE  Requesting MD/Service: Arida/Cardiology Date of consult: 08/14/15 Reason for consultation: Dyspnea  HPI:  74 yo M referred for evaluation of progressive and now severe DOE. He dates the onset of his symptoms to December 2015 when he was diagnosed with PNA and never got better. He has been seen by Cardiology and undergone recent evaluation that included Myoview which revealed a moderate area of mild decreased uptake affecting the basal/mid inferolateral segments, lateral apical segment, mid anterolateral segment, no significant reversibility followed by LHC/RHC which revealed diffuse nonobstructive coronary disease, patent stents and mild PAH. As a consequence of that nonrevealing evaluation, he is referred for further evaluation to me. He reports class 3 dyspnea with no significant CP, cough, sputum, hemoptysis, LE edema or calf tenderness. This has been gradually progressive over the past year. He has no unusual occupational or environmental exposures and has never been given any diagnoses of chronic pulmonary problems. He has minimal remote smoking history. No family history of lung diseases  Past Medical History  Diagnosis Date  . History of ASCVD 04/18/08    2 vessel. s/p drug-elutign stent, mid LAD  . NSTEMI (non-ST elevated myocardial infarction) ([place])     occluded RV marginal (st[facility])  . Angina pectoris ([place])   . Type II or unspecified type diabetes mellitus without mention of complication, not stated as uncontrolled   . Prostate carcinoma ([employer])   . Diverticulosis   . Encounter for long-term (current) use of other medications   . Essential hypertension, benign   . Pure hypercholesterolemia   . PVD (peripheral vascular disease) ([facility])     abnormal LE arterial dopplers with mild nonobstructive disease throughout RLE, moderate mid left SFA stenosis, normal resting ABI's  . Coronary atherosclerosis of native coronary artery     s/p PCI of the mid LAD 2009,  paltinum study stent RCA [id number] complicated by NSTEMI from occluded RV marginal from jailed stent, repeat cath 2011 PCI of distal RCA    Past Surgical History  Procedure Laterality Date  . Prostatectomy      total  . Tonsillectomy      remote  . Percutaneous coronary stent intervention (pci-s)  06/17/10  . Left heart cath  06/17/10  . Cardiac catheterization    . Coronary angioplasty    . Cardiac catheterization N/A 07/17/2015    Procedure: Right/Left Heart Cath and Coronary Angiography;  Surgeon: Wellington Hampshire, MD;  Location: [facility];  Service: Cardiovascular;  Laterality: N/A;    MEDICATIONS: I have reviewed all medications and confirmed regimen as documented  Social History   Social History  . Marital Status: Married    Spouse Name: N/A  . Number of Children: N/A  . Years of Education: N/A   Occupational History  . Not on file.   Social History Main Topics  . Smoking status: Former Research scientist (life sciences)  . Smokeless tobacco: Not on file     Comment: quit 1989  . Alcohol Use: Yes     Comment: rare  . Drug Use: No  . Sexual Activity: Not on file   Other Topics Concern  . Not on file   Social History Narrative    Family History  Problem Relation Age of Onset  . Alzheimer's disease Mother   . Heart attack Mother   . Heart disease Mother   . Cancer Father   . Heart disease Father   . Heart attack Father   . Heart attack Brother   .  Heart disease Brother   . Heart attack Brother   . Heart disease Brother   . Cancer Brother     ROS: No fever, myalgias/arthralgias, unexplained weight loss or weight gain No new focal weakness or sensory deficits No otalgia, hearing loss, visual changes, nasal and sinus symptoms, mouth and throat problems No neck pain or adenopathy No abdominal pain, N/V/D, diarrhea, change in bowel pattern No dysuria, change in urinary pattern No LE edema or calf tenderness   Filed Vitals:   08/14/15 1001  BP: 150/82  Pulse: 107   Height: '5\' 6"'$  (1.676 m)  Weight: 209 lb (94.802 kg)  SpO2: 90%     EXAM:  Gen: WDWN, panting respirations with minimal exertion HEENT: NCAT, TMs and canals normal, sclera white, nares and nasal mucosa normal, oropharynx normal Neck: Supple without LAN, thyromegaly, JVD Lungs: breath sounds diminished, percussion note normal throughout, R>L basilar crackles Cardiovascular: Reg rhythm, normal rate, no murmurs noted Abdomen: Soft, nontender, normal BS Ext: without clubbing, cyanosis, edema Neuro: CNs grossly intact, motor and sensory intact, DTRs symmetric Skin: Limited exam, no lesions noted  DATA:   BMP Latest Ref Rng 07/17/2015 01/25/2015 06/10/2010  Glucose 65 - 99 mg/dL 187(H) 127(H) 130(H)  BUN 6 - 20 mg/dL '12 19 13  '$ Creatinine 0.61 - 1.24 mg/dL 0.84 0.81 0.73  Sodium 135 - 145 mmol/L 139 136 139  Potassium 3.5 - 5.1 mmol/L 4.1 4.3 4.0  Chloride 101 - 111 mmol/L 102 100 107  CO2 22 - 32 mmol/L 28 33(H) 27  Calcium 8.9 - 10.3 mg/dL 9.2 9.2 8.6    CBC Latest Ref Rng 07/15/2015 06/10/2010  WBC 3.8 - 10.6 K/uL 10.0 8.7 WHITE COUNT CONFIRMED ON SMEAR  Hemoglobin 13.0 - 18.0 g/dL 14.8 13.3  Hematocrit 40.0 - 52.0 % 44.5 39.4  Platelets 150 - 440 K/uL 253 195 PLATELET COUNT CONFIRMED BY SMEAR    CXR (07/11/15):  Diffuse reticulo-nodular markings c/w ILD RA ambulatory SpO2 (08/14/15): desaturated into upper 70s with one lap around the office area   IMPRESSION:   Disabling dyspnea and severe exertional hypoxemia due to ILD which is likely chronic. I suspect that the diagnosis of PNA last year was the first manifestation of this process   PLAN:  1) ANA, ESR, CRP, ACE level 2) Home O2 arranged - 2 lpm by [facility] as close to 24 hrs per day 3) HRCT - ILD protocol, PFTs ordered 4) ROV next week after HRCT - need to decide on next diagnostic step which might include bronchoscopy with BAL/TBBx or surgical lung biopsy. He will follow up with Dr Mortimer Fries in my absence   PT  INSTRUCTION: You have interstitial lung disease (inflammation or scarring in the lungs) This is causing you to have low oxygen, especially with exertion I do not currently know the cause of this problem but it probably has been present for many months or longer and probably was what was called pneumonia last year The evaluation of this requires blood work today, a CT scan of your chest and lung function measurements Because of your low oxygen levels with exertion, I have prescribed oxygen for you to be worn as close to 24 hrs/day as possible  It is most important to wear it with sleep and exertion  Wilhelmina Mcardle, MD [facility], Critical Care Medicine

## 2015-08-16 ENCOUNTER — Ambulatory Visit (INDEPENDENT_AMBULATORY_CARE_PROVIDER_SITE_OTHER): Payer: Medicare Other | Admitting: Physician Assistant

## 2015-08-16 ENCOUNTER — Encounter: Payer: Self-pay | Admitting: Physician Assistant

## 2015-08-16 VITALS — BP 138/62 | HR 86 | Ht 66.0 in | Wt 206.2 lb

## 2015-08-16 DIAGNOSIS — I2510 Atherosclerotic heart disease of native coronary artery without angina pectoris: Secondary | ICD-10-CM | POA: Diagnosis not present

## 2015-08-16 DIAGNOSIS — I25119 Atherosclerotic heart disease of native coronary artery with unspecified angina pectoris: Secondary | ICD-10-CM | POA: Diagnosis not present

## 2015-08-16 DIAGNOSIS — R0602 Shortness of breath: Secondary | ICD-10-CM | POA: Diagnosis not present

## 2015-08-16 DIAGNOSIS — R600 Localized edema: Secondary | ICD-10-CM | POA: Diagnosis not present

## 2015-08-16 DIAGNOSIS — E7800 Pure hypercholesterolemia, unspecified: Secondary | ICD-10-CM

## 2015-08-16 DIAGNOSIS — I10 Essential (primary) hypertension: Secondary | ICD-10-CM

## 2015-08-16 MED ORDER — FUROSEMIDE 20 MG PO TABS: 20.0000 mg | ORAL_TABLET | Freq: Every day | ORAL | Status: DC | PRN

## 2015-08-16 NOTE — Patient Instructions (Addendum)
Medication Instructions:  Please start Lasix 20 mg once daily as needed for leg swelling  Labwork: BMET  Testing/Procedures: None  Follow-Up: 6 months w/ Dr. Fletcher Anon  If you need a refill on your cardiac medications before your next appointment, please call your pharmacy.  Basic Metabolic Panel WHY AM I HAVING THIS TEST? A basic metabolic panel measures levels of the following substances in your blood:   Glucose. Glucose is a simple sugar that serves as the main source of energy for your body.  Creatinine. Creatinine is a waste product of normal muscle activity. It is excreted from the body by the kidneys.  Blood urea nitrogen (BUN). Urea nitrogen is a waste product of protein breakdown. It is produced when excess protein in your body is broken down and used for energy. It is excreted by the kidneys.  Electrolytes. Electrolytes are negatively or positively charged particles that are dissolved in the water of different body compartments. This includes the serum portion of blood, water inside cells, and water outside cells. Concentrations of electrolytes vary among the different fluid compartments. Electrolytes are tightly regulated to maintain a salt-water and acid-base balance in the body. The electrolytes measured in a basic metabolic panel include:  Potassium.  Sodium.  Chloride.  Calcium.  Bicarbonate. WHAT KIND OF SAMPLE IS TAKEN? A blood sample is required for this test. It is usually collected by inserting a needle into a vein. HOW DO I PREPARE FOR THE TEST? Your health care provider may ask you to avoid eating or drinking anything before your blood sample is taken. Do not eat or drink anything after midnight on the night before the procedure or as directed by your health care provider. WHAT ARE THE REFERENCE RANGES? Reference ranges are considered healthy ranges established after testing a large group of healthy people. Reference ranges may vary among different people,  labs, and hospitals. It is your responsibility to obtain your test results. Ask the lab or department performing the test when and how you will get your results. The following are reference ranges for each part of a basic metabolic panel: Glucose  Cord: 45-96 mg/dL or 2.5-5.3 mmol/L (SI units).  Premature infant: 20-60 mg/dL or 1.1-3.3 mmol/L.  Neonate: 30-60 mg/dL or 1.7-3.3 mmol/L.  Infant: 40-90 mg/dL or 2.2-5 mmol/L.  Child under 40 years old: 60-100 mg/dL or 3.3-5.5 mmol/L.  Adult or child over 39 years old:  Fasting: 70-110 mg/dL or less than 6.1 mmol/L.  Random (nonfasting or casual): less than or equal to 200 mg/dL or less than 11.1 mmol/L.  Elderly: increase in normal range after age 19 years. Creatinine  Child under 99 years old: 0.1-0.4 mg/dL.  Child 48-56 years old: 0.2-0.5 mg/dL.  Child 67-44 years old: 0.3-0.6 mg/dL.  Child or adolescent 6-40 years old: 0.4-1 mg/dL.  Adult 35-84 years old:  Male: 0.5-1 mg/dL.  Male: 0.6-1.2 mg/dL.  Adult 60-42 years old:  Male: 0.5-1.1 mg/dL.  Male: 0.6-1.3 mg/dL.  Adult 44 years old and above:  Male: 0.5-1.2 mg/dL.  Male: 0.7-1.3 mg/dL. BUN  Cord: 21-40 mg/dL.  Newborn: 3-12 mg/dL.  Infant: 5-18 mg/dL.  Child: 5-18 mg/dL.  Adult: 10-20 mg/dL or 3.6-7.1 mmol/L (SI units).  Elderly: may be slightly higher than adult. Potassium  Newborn: 3.9-5.9 mEq/L.  Infant: 4.1-5.3 mEq/L.  Child: 3.4-4.7 mEq/L.  Adult or elderly: 3.5-5 mEq/L or 3.5-5 mmol/L (SI units). Sodium  Newborn: 134-144 mEq/L.  Infant: 134-150 mEq/L.  Child: 136-145 mEq/L.  Adult or elderly: 136-145 mEq/L or 136-145  mmol/L (SI units). Chloride  Premature infant: 95-110 mEq/L.  Newborn: 96-106 mEq/L.  Child: 90-110 mEq/L.  Adult or elderly: 98-106 mEq/L or 98-106 mmol/L (SI units). Calcium  Total calcium:  Newborn under 50 days old: 7.6-10.4 mg/dL or 1.9-2.6 mmol/L.  Umbilical: [id number] mg/dL or 2.25-2.88 mmol/L.  10  days to 74 years old: 9-10.6 mg/dL or 2.30-2.65 mmol/L.  Child: 8.8-10.8 mg/dL or 2.2-2.7 mmol/L.  Adult: 9-10.5 mg/dL or 2.25-2.62 mmol/L.  Ionized calcium:  Newborn: 4.20-5.58 mg/dL or 1.05-1.37 mmol/L.  2 months to 74 years old: 4.80-5.52 mg/dL or 1.20-1.38 mmol/L.  Adult: 4.5-5.6 mg/dL or 1.05-1.30 mmol/L. Bicarbonate  Newborn: 13-22 mEq/L.  Infant: 20-28 mEq/L.  Child: 20-28 mEq/L.  Adult or elderly: 23-30 mEq/L or 23-30 mmol/L (SI units). WHAT DO THE RESULTS MEAN? Diet and levels of activity can have an effect on your test results. Sometimes they can be the cause of values that are outside of normal limits. However, sometimes values outside normal limits can indicate a medical disorder.  Glucose:  Abnormally high glucose levels (hyperglycemia) are usually associated with prediabetes mellitus and diabetes mellitus. They can also occur with severe stress on the body. This can come from surgery or events such as stroke or trauma. Overactive thyroid gland and pancreatitis or pancreatic cancer can also cause abnormally high glucose levels.  Abnormally low glucose levels (hypoglycemia) can occur with underactive thyroid gland and rare insulin-secreting tumors (insulinoma).  Creatinine:  Abnormally high creatinine levels are most commonly seen in kidney failure. They can also be seen with overactive thyroid (hyperthyroidism), conditions related to overgrowth of the body, abnormal breakdown of muscle tissue, and early muscular dystrophy.  Abnormally low creatinine levels can indicate low muscle mass associated with malnutrition or late-stage muscular dystrophy.  BUN:  Abnormally high BUN levels generally mean that your kidneys are not functioning normally.  Abnormally low BUN levels can be seen with malnutrition and liver failure.  Potassium:  Abnormally high potassium levels are most often seen with kidney disease, massive destruction of red blood cells, and adrenal gland  failure.  Abnormally low potassium levels are seen with excessive levels of the hormone aldosterone.  Sodium:  Abnormally high sodium levels can be seen with dehydration, excessive thirst, and urination due to abnormally low levels of antidiuretic hormone (diabetes insipidus). They can also be seen with excessive levels of aldosterone or cortisol in the body.  Abnormally low levels of sodium can be seen with congestive heart failure, cirrhosis of the liver, kidney failure, and the syndrome of inappropriate antidiuretic hormone (SIADH). Chloride:  Abnormally high levels of chloride can be seen with acute kidney failure, diabetes insipidus, prolonged diarrhea, and poisoning with aspirin or bromide.  Abnormally low levels of chloride can be seen with prolonged vomiting, acute adrenal gland failure, and SIADH.  Calcium:  Abnormally high levels of calcium can occur with excessive activity of the parathyroid glands, certain cancers, and a type of inflammation seen in sarcoidosis and tuberculosis.  Abnormally low levels of calcium can be seen with underactive parathyroid glands, vitamin D deficiency, and acute pancreatitis.  Bicarbonate:  Abnormally high bicarbonate levels are seen after prolonged vomiting and diuretic therapy, which lead to a decrease in the amount of acid in the body (metabolic alkalosis). They can also be seen in conditions that increase the amount of bicarbonate in the body. These conditions include rare hereditary disorders that interfere with how your kidneys handle electrolytes.  Abnormally low bicarbonate levels are seen with conditions that cause your body to  produce too much acid (metabolic acidosis). These conditions include uncontrolled diabetes mellitus and poisoning with aspirin, methanol, or antifreeze (ethylene glycol). Talk with your health care provider to discuss your results, treatment options, and if necessary, the need for more tests. Talk with your health  care provider if you have any questions about your results.   This information is not intended to replace advice given to you by your health care provider. Make sure you discuss any questions you have with your health care provider.   Document Released: 09/09/2004 Document Revised: 09/07/2014 Document Reviewed: 01/15/2014 Elsevier Interactive Patient Education [employer].

## 2015-08-16 NOTE — Progress Notes (Signed)
Cardiology Office Note:  Date of Encounter: 08/16/2015  ID: Adam Washington, DOB 1941-05-31, MRN [id number]  PCP:  Fae Pippin Primary Cardiologist:  Dr. Fletcher Anon, MD  Chief Complaint  Patient presents with  . other    F/u cardiac cath. Meds reviewed verbally with pt.    HPI:  74 year old male with a history of ASCAD s/p PCI of the mid LAD in 8/09 and then platinum stent to the mid-distal RCA complicated by NSTEMI from jailed RV marginal and then PCI of the distal RCA in 10/11 with BMS overlapped with the prior stent. He also has a history of dyslipidemia, type 2 diabetes and HTN. He is normally followed by Dr. Golden Hurter and before that Dr. Ilda Foil; however The patient was referred by Dr. Tami Ribas from ENT to Dr. Fletcher Anon for a second opinion regarding continued shortness of breath. The patient reported having pneumonia in December 2015 and since then he reported that he had not recovered at all. He was seen by Dr. Fletcher Anon on 11/11 and was now complaining of dyspnea with minimal activities which was very unusual for him because he is usually very active. He was having a hard time going up one flight of stairs. He reported that these symptoms were similar to his prior angina. He felt very limited and he was very frustrated. He quit smoking 30 years ago and was not aware of history of COPD. He has no history of DVT or pulmonary embolism. He underwent a nuclear stress test in May of this year which showed no evidence of ischemia with normal ejection fraction. He did have a recent chest x-ray done which showed bilateral hypoinflation with increased pulmonary interstitial markings which could reflect edema or atypical pneumonia. Because of his ongoing symptoms, even in the setting of his negative stress test, underlying obstructive CAD could not be excluded. Thus he underwent right and left cardiac cath on 07/17/2015 which showed widely patent LAD and RCA stents with mild ISR, prox to mid LAD 30%  stenosis, D2 20%, mid LCx 40%, prox RCA 20%, mid RCA 30%, mid to distal RCA 10%, normal LVSF. Right heart cath revealed only mild pulmonary hypertension with normal fillin pressure and low normal cardiac output. PA pressure was 37/10 with a mean of 21 mm Hg, PCW 5, CO 4.79. No evidence of AS or significant MR. His SOB did not seem to be cardiac in etiology and pulmonary evaluation was recommended. He was seen by Dr. Alva Garnet on 12/14 who felt like repeat CXR on 07/11/2015 was c/w ILD. He also desaturated into the upper 70s with one lap around the office on room air. It was felt the diagnosis of PNA in 07/2014 was the initial manifestation of this process. He was started on home O2 with further pulmonary evaluation pending with BAL vs surgical lung biopsy.   He presents to the office today on room air. He has been wearing his home oxygen at home almost all of the time, though prefers to not wear it when he goes out of the house. He feels like it does make a small difference in the way he feels. He is anxious regarding his pulmonary work up. He plans to get prior imaging for pulmonary to review at his next appointment. He notes a dry cough. He has had a couple fleeting episodes of chest pain lasting only seconds before self resolving. He also notes mild bilateral ankle swelling on most days. He eats out at restaurants on most days as  his wife no longer cooks. He denies any orthopnea, PND, or early satiety. BP at home has been stable.     Past Medical History  Diagnosis Date  . History of ASCVD 04/18/08    2 vessel. s/p drug-elutign stent, mid LAD  . NSTEMI (non-ST elevated myocardial infarction) ([place])     occluded RV marginal (stent jail)  . Angina pectoris ([place])   . Type II or unspecified type diabetes mellitus without mention of complication, not stated as uncontrolled   . Prostate carcinoma ([place])   . Diverticulosis   . Encounter for long-term (current) use of other medications   . Essential  hypertension, benign   . Pure hypercholesterolemia   . PVD (peripheral vascular disease) ([facility])     abnormal LE arterial dopplers with mild nonobstructive disease throughout RLE, moderate mid left SFA stenosis, normal resting ABI's  . Coronary atherosclerosis of native coronary artery     a. s/p PCI of the mid LAD 2009, paltinum study stent RCA 7622 complicated by NSTEMI from occluded RV marginal from jailed stent; b. repeat cath 2011 PCI of distal RCA; c. cath 08/2015: p-mLAD 30% widely patent stent, mLCx 40%, pRCA 20%, mRCA 30% widely patent stent, RHC PA pressure was 37/10 with a mean of 21 mmHg, PCW : 5, CO: 4.79, nl LVSF  . Interstitial lung disease ([place])     a. on home O2  :  Past Surgical History  Procedure Laterality Date  . Prostatectomy      total  . Tonsillectomy      remote  . Percutaneous coronary stent intervention (pci-s)  06/17/10  . Left heart cath  06/17/10  . Cardiac catheterization    . Coronary angioplasty    . Cardiac catheterization N/A 07/17/2015    Procedure: Right/Left Heart Cath and Coronary Angiography;  Surgeon: Wellington Hampshire, MD;  Location: [facility];  Service: Cardiovascular;  Laterality: N/A;  :  Social History:  The patient  reports that he has quit smoking. He does not have any smokeless tobacco history on file. He reports that he drinks alcohol. He reports that he does not use illicit drugs.   Family History  Problem Relation Age of Onset  . Alzheimer's disease Mother   . Heart attack Mother   . Heart disease Mother   . Cancer Father   . Heart disease Father   . Heart attack Father   . Heart attack Brother   . Heart disease Brother   . Heart attack Brother   . Heart disease Brother   . Cancer Brother      Allergies:  Allergies  Allergen Reactions  . Plavix [Clopidogrel Bisulfate] Hives     Home Medications:  Current Outpatient Prescriptions  Medication Sig Dispense Refill  . amLODipine (NORVASC) 5 MG tablet Take 1  tablet (5 mg total) by mouth daily. 90 tablet 3  . aspirin 325 MG tablet Take 325 mg by mouth daily.    Marland Kitchen glipiZIDE (GLUCOTROL XL) 10 MG 24 hr tablet Take 10 mg by mouth daily with breakfast.     . hydrochlorothiazide (MICROZIDE) 12.5 MG capsule TAKE ONE CAPSULE BY MOUTH ONCE DAILY 90 capsule 1  . metoprolol succinate (TOPROL-XL) 100 MG 24 hr tablet Take 1 tablet (100 mg total) by mouth daily. Take with or immediately following a meal. 30 tablet 6  . nitroGLYCERIN (NITROSTAT) 0.4 MG SL tablet Place 1 tablet (0.4 mg total) under the tongue every 5 (five) minutes as needed for  chest pain. 25 tablet 3  . pioglitazone-metformin (ACTOPLUS MET) 15-500 MG per tablet Take 3 tablets by mouth daily.     . rosuvastatin (CRESTOR) 40 MG tablet Take 1 tablet (40 mg total) by mouth daily. 30 tablet 6  . ZETIA 10 MG tablet TAKE ONE TABLET BY MOUTH ONCE DAILY 90 tablet 0   No current facility-administered medications for this visit.     Review of Systems:  Review of Systems  Constitutional: Positive for weight loss and malaise/fatigue. Negative for fever, chills and diaphoresis.  HENT: Positive for congestion and sore throat.   Eyes: Negative for discharge and redness.  Respiratory: Positive for cough and shortness of breath. Negative for hemoptysis, sputum production and wheezing.   Cardiovascular: Positive for leg swelling. Negative for chest pain, palpitations, orthopnea, claudication and PND.       Trace bilateral ankle swelling  Gastrointestinal: Negative for nausea, vomiting and abdominal pain.  Musculoskeletal: Negative for falls.  Skin: Negative for rash.  Neurological: Positive for weakness. Negative for dizziness, sensory change, speech change, focal weakness, loss of consciousness and headaches.  Endo/Heme/Allergies: Does not bruise/bleed easily.  Psychiatric/Behavioral: The patient is not nervous/anxious.   All other systems reviewed and are negative.    Physical Exam:  Blood pressure  138/62, pulse 86, height '5\' 6"'$  (1.676 m), weight 206 lb 4 oz (93.554 kg). BMI: Body mass index is 33.31 kg/(m^2). General: Pleasant, NAD. Psych: Normal affect. Responds to questions with normal affect.  Neuro: Alert and oriented X 3. Moves all extremities spontaneously. HEENT: Normocephalic, atraumatic. EOM intact. Sclera anicteric.  Neck: Trachea midline. Supple without bruits or JVD. Lungs:  Decreased breath sounds bilaterally with right base crackles.  Heart: RRR, normal s3, s4. No murmurs, rubs, or gallops. Cardiac cath site along right groin without bruit, hematoma, erythema, swelling, active bleeding, or TTP. Femoral pulse 2+.  Abdomen: Soft, non-tender, non-distended, BS + x 4.  Extremities: No clubbing, cyanosis or edema. DP/PT/Radials 2+ and equal bilaterally.   Accessory Clinical Findings:  EKG: NSR, 86 bpm, left axis deviation, LVH, poor R wave progression, no significant st/t changes  Recent Labs: 01/25/2015: ALT 12 07/15/2015: Hemoglobin 14.8; Platelets 253 07/17/2015: BUN 12; Creatinine, Ser 0.84; Potassium 4.1; Sodium 139  01/25/2015: Cholesterol 116; HDL 37.90*; LDL Cholesterol 63; Total CHOL/HDL Ratio 3; Triglycerides 76.0; VLDL 15.2  CrCl cannot be calculated (Patient has no serum creatinine result on file.).  Weights: Wt Readings from Last 3 Encounters:  08/16/15 206 lb 4 oz (93.554 kg)  08/14/15 209 lb (94.802 kg)  07/17/15 207 lb (93.895 kg)    Other studies Reviewed: Additional studies/ records that were reviewed today include: Cardiology, pulmonary, and cath notes.  Assessment & Plan:  1. CAD s/p multiple PCI as above: -Recent left and right cardiac cath as above demonstrating patent stents, otherwise no significant CAD -Continue medical management with aspirin Toprol XL 100 mg daily, Crestor 40 mg -SL NTG prn  2. Hypoxia/dyspnea/possible ILD: -Chest CT scheduled per pulmonary -Has follow up with pulmonary -Home O2  3. Lower extremity swelling: -PRN  Lasix 20 mg -Will discontinue HCTZ so not to deplete his potassium stores if it appears he is taking this on a regular basis when we call with his labs on 12/19  4. HLD: -Crestor and Zetia   Dispo: -Follow up 6 months with Dr. Fletcher Anon, MD  Current medicines are reviewed at length with the patient today.  The patient did not have any concerns regarding medicines.   Christell Faith,  PA-C [place], [place] [address] [phone] [facility] 08/16/2015, 1:32 PM

## 2015-08-17 LAB — BASIC METABOLIC PANEL
BUN/Creatinine Ratio: 24 — ABNORMAL HIGH (ref 10–22)
BUN: 22 mg/dL (ref 8–27)
CHLORIDE: 95 mmol/L — AB (ref 96–106)
CO2: 25 mmol/L (ref 18–29)
Calcium: 9.4 mg/dL (ref 8.6–10.2)
Creatinine, Ser: 0.92 mg/dL (ref 0.76–1.27)
GFR calc Af Amer: 94 mL/min/{1.73_m2} (ref >59)
GFR calc non Af Amer: 82 mL/min/{1.73_m2} (ref >59)
GLUCOSE: 218 mg/dL — AB (ref 65–99)
POTASSIUM: 4.5 mmol/L (ref 3.5–5.2)
SODIUM: 136 mmol/L (ref 134–144)

## 2015-08-19 ENCOUNTER — Telehealth: Payer: Self-pay

## 2015-08-19 ENCOUNTER — Ambulatory Visit (INDEPENDENT_AMBULATORY_CARE_PROVIDER_SITE_OTHER): Payer: Medicare Other | Admitting: *Deleted

## 2015-08-19 ENCOUNTER — Other Ambulatory Visit
Admission: RE | Admit: 2015-08-19 | Discharge: 2015-08-19 | Disposition: A | Discharge status: Home / Self Care | Payer: Medicare Other | Source: Ambulatory Visit | Attending: Pulmonary Disease | Admitting: Pulmonary Disease

## 2015-08-19 ENCOUNTER — Encounter: Payer: Self-pay | Admitting: Physician Assistant

## 2015-08-19 ENCOUNTER — Ambulatory Visit: Payer: Medicare Other | Admitting: *Deleted

## 2015-08-19 DIAGNOSIS — Z01812 Encounter for preprocedural laboratory examination: Secondary | ICD-10-CM | POA: Diagnosis not present

## 2015-08-19 DIAGNOSIS — I2510 Atherosclerotic heart disease of native coronary artery without angina pectoris: Secondary | ICD-10-CM | POA: Insufficient documentation

## 2015-08-19 DIAGNOSIS — R0602 Shortness of breath: Secondary | ICD-10-CM

## 2015-08-19 DIAGNOSIS — J849 Interstitial pulmonary disease, unspecified: Secondary | ICD-10-CM | POA: Diagnosis not present

## 2015-08-19 LAB — PULMONARY FUNCTION TEST
DL/VA % pred: 97 %
DL/VA: 4.2 ml/min/mmHg/L
DLCO unc % pred: 47 %
DLCO unc: 12.75 ml/min/mmHg
FEF 25-75 Post: 3.52 L/sec
FEF 25-75 Pre: 2.72 L/sec
FEF2575-%Change-Post: 29 %
FEF2575-%Pred-Post: 187 %
FEF2575-%Pred-Pre: 144 %
FEV1-%Change-Post: 3 %
FEV1-%Pred-Post: 59 %
FEV1-%Pred-Pre: 57 %
FEV1-Post: 1.55 L
FEV1-Pre: 1.49 L
FEV1FVC-%Change-Post: 0 %
FEV1FVC-%Pred-Pre: 127 %
FEV6-%Change-Post: 3 %
FEV6-%Pred-Post: 49 %
FEV6-%Pred-Pre: 47 %
FEV6-Post: 1.64 L
FEV6-Pre: 1.59 L
FEV6FVC-%Pred-Post: 107 %
FEV6FVC-%Pred-Pre: 107 %
FVC-%Change-Post: 2 %
FVC-%Pred-Post: 45 %
FVC-%Pred-Pre: 44 %
FVC-Post: 1.64 L
FVC-Pre: 1.6 L
Post FEV1/FVC ratio: 94 %
Post FEV6/FVC ratio: 100 %
Pre FEV1/FVC ratio: 93 %
Pre FEV6/FVC Ratio: 100 %
RV % pred: 29 %
RV: 0.68 L
TLC % pred: 40 %
TLC: 2.5 L

## 2015-08-19 LAB — CBC
HCT: 44.7 % (ref 40.0–52.0)
Hemoglobin: 14.9 g/dL (ref 13.0–18.0)
MCH: 28.6 pg (ref 26.0–34.0)
MCHC: 33.3 g/dL (ref 32.0–36.0)
MCV: 85.6 fL (ref 80.0–100.0)
Platelets: 242 10*3/uL (ref 150–440)
RBC: 5.21 MIL/uL (ref 4.40–5.90)
RDW: 13.5 % (ref 11.5–14.5)
WBC: 8.7 10*3/uL (ref 3.8–10.6)

## 2015-08-19 LAB — BASIC METABOLIC PANEL
ANION GAP: 10 (ref 5–15)
BUN: 24 mg/dL — AB (ref 6–20)
CALCIUM: 9.5 mg/dL (ref 8.9–10.3)
CO2: 29 mmol/L (ref 22–32)
Chloride: 100 mmol/L — ABNORMAL LOW (ref 101–111)
Creatinine, Ser: 0.81 mg/dL (ref 0.61–1.24)
GFR calc Af Amer: >60 mL/min (ref >60)
GLUCOSE: 199 mg/dL — AB (ref 65–99)
Potassium: 3.9 mmol/L (ref 3.5–5.1)
SODIUM: 139 mmol/L (ref 135–145)

## 2015-08-19 LAB — PROTIME-INR
INR: 1
Prothrombin Time: 13.4 seconds (ref 11.4–15.0)

## 2015-08-19 LAB — C-REACTIVE PROTEIN: CRP: 1.3 mg/dL — ABNORMAL HIGH (ref <1.0)

## 2015-08-19 LAB — SEDIMENTATION RATE: Sed Rate: 40 mm/hr — ABNORMAL HIGH (ref 0–20)

## 2015-08-19 NOTE — Progress Notes (Signed)
SMW performed today. 

## 2015-08-19 NOTE — Progress Notes (Signed)
Please call patient bmet showed normal renal function and K+. Glucose was elevated at 218. He should follow up with his PCP for this. If he needs to take the Lasix regularly for lower extremity swelling I would like for him to replace it with the HCTZ so not to deplete his potassium store. This will keep him from having to go on yet another medication.

## 2015-08-19 NOTE — Progress Notes (Signed)
PFT performed today with Nitrogen washout. 

## 2015-08-19 NOTE — Telephone Encounter (Signed)
S/w male who answered home phone. States he is at a doctor's appt at this time. Will call back

## 2015-08-20 ENCOUNTER — Ambulatory Visit
Admission: RE | Admit: 2015-08-20 | Discharge: 2015-08-20 | Disposition: A | Discharge status: Home / Self Care | Payer: Medicare Other | Source: Ambulatory Visit | Attending: Pulmonary Disease | Admitting: Pulmonary Disease

## 2015-08-20 DIAGNOSIS — J849 Interstitial pulmonary disease, unspecified: Secondary | ICD-10-CM

## 2015-08-20 DIAGNOSIS — I2510 Atherosclerotic heart disease of native coronary artery without angina pectoris: Secondary | ICD-10-CM | POA: Insufficient documentation

## 2015-08-20 DIAGNOSIS — F17210 Nicotine dependence, cigarettes, uncomplicated: Secondary | ICD-10-CM | POA: Diagnosis not present

## 2015-08-20 LAB — ANA COMPREHENSIVE PANEL
Anti JO-1: <0.2 AI (ref 0.0–0.9)
Centromere Ab Screen: <0.2 AI (ref 0.0–0.9)
Ribonucleic Protein: <0.2 AI (ref 0.0–0.9)
SSA (Ro) (ENA) Antibody, IgG: <0.2 AI (ref 0.0–0.9)
Scleroderma (Scl-70) (ENA) Antibody, IgG: <0.2 AI (ref 0.0–0.9)
ds DNA Ab: 1 IU/mL (ref 0–9)

## 2015-08-20 LAB — ANGIOTENSIN CONVERTING ENZYME: ANGIOTENSIN-CONVERTING ENZYME: 23 U/L (ref 14–82)

## 2015-08-20 NOTE — Telephone Encounter (Signed)
S/w pt regarding labs and recommendations.  Pt states his BS was elevated the day of labs because he forgot to take his medications. He does not want labs sent to PCP. Pt agreeable w/plan with no questions.

## 2015-08-22 ENCOUNTER — Ambulatory Visit (INDEPENDENT_AMBULATORY_CARE_PROVIDER_SITE_OTHER): Payer: Medicare Other | Admitting: Internal Medicine

## 2015-08-22 ENCOUNTER — Encounter: Payer: Self-pay | Admitting: Internal Medicine

## 2015-08-22 VITALS — BP 158/82 | HR 85 | Ht 66.0 in | Wt 204.0 lb

## 2015-08-22 DIAGNOSIS — I2510 Atherosclerotic heart disease of native coronary artery without angina pectoris: Secondary | ICD-10-CM

## 2015-08-22 DIAGNOSIS — J84112 Idiopathic pulmonary fibrosis: Secondary | ICD-10-CM | POA: Diagnosis not present

## 2015-08-22 DIAGNOSIS — J849 Interstitial pulmonary disease, unspecified: Secondary | ICD-10-CM | POA: Diagnosis not present

## 2015-08-22 DIAGNOSIS — R0602 Shortness of breath: Secondary | ICD-10-CM

## 2015-08-22 DIAGNOSIS — J309 Allergic rhinitis, unspecified: Secondary | ICD-10-CM

## 2015-08-22 MED ORDER — ALBUTEROL SULFATE HFA 108 (90 BASE) MCG/ACT IN AERS: 2.0000 | INHALATION_SPRAY | RESPIRATORY_TRACT | Status: AC | PRN

## 2015-08-22 MED ORDER — CETIRIZINE HCL 10 MG PO CAPS: 10.0000 mg | ORAL_CAPSULE | Freq: Every morning | ORAL | Status: AC

## 2015-08-22 MED ORDER — IPRATROPIUM BROMIDE 0.03 % NA SOLN: 2.0000 | Freq: Four times a day (QID) | NASAL | Status: AC

## 2015-08-22 NOTE — Patient Instructions (Signed)
Pulmonary Fibrosis °Pulmonary fibrosis is a type of lung disease that causes scarring. Over time, the scar tissue (fibrosis) builds up in the air sacs of your lungs. This makes it hard for you to breathe. Less oxygen can get into your blood. °Scarring from pulmonary fibrosis gets worse over time. This damage is permanent. Having damaged lungs may make it more likely that you will have heart problems as well. °CAUSES °Usually, the cause of pulmonary fibrosis is not known (idiopathic pulmonary fibrosis). However, pulmonary fibrosis can be caused by:  °· Exposure to occupational and environmental toxins. These include asbestos, silica, and metal dusts. °· Inhaling moldy hay. This can cause an allergic reaction in the lung (farmer's lung) that can lead to pulmonary fibrosis. °· Inhaling toxic fumes. °· Certain medicines. These include drugs used in radiation therapy or used to treat seizures, heart problems, and some infections. °· Autoimmune diseases, such as rheumatoid arthritis, systemic sclerosis, and connective tissue diseases. °· Sarcoidosis. In this disease, areas of inflammatory cells (granulomas) form and most often affect the lungs. °· Genes. Some cases of pulmonary fibrosis may be passed down through families. °RISK FACTORS °You may be at a higher risk for developing pulmonary fibrosis if: °· You have a family history of the disease. °· You have an autoimmune disease or another condition linked to pulmonary fibrosis. °· You are exposed to certain substances or fumes found in agricultural, farm, construction, or factory work. °· You take certain medicines. °SIGNS AND SYMPTOMS °Symptoms may include: °· Difficulty breathing that gets worse with activity. °· Dry, hacking cough. °· Rapid, shallow breathing during exercise or while at rest. °· Shortness of breath that gets worse (dyspnea). °· Bluish skin and lips. °· Loss of appetite. °· Loss of strength. °· Weight loss and fatigue. °· Rounded and enlarged  fingertips (clubbing). °DIAGNOSIS °Your health care provider may suspect pulmonary fibrosis based on your symptoms and medical history. Diagnosis may include a physical exam. Your health care provider will check for signs that strongly suggest that you have pulmonary fibrosis, such as: °· Blue skin around your fingernails or mouth from reduced oxygen. °· Clubbing around the ends of your fingers. °· A crackling sound when you breathe. °Your health care provider may also do tests to confirm the diagnosis. These may include: °· Looking inside your lungs with an instrument (bronchoscopy). °· Imaging studies of your lungs and heart using: °¨ X-rays. °¨ CT scan. °¨ Sound waves (echocardiogram). °· Tests to measure how well you are breathing (pulmonary function tests). °· Exercise testing to see how well your lungs work while you are walking. °· Blood tests. °· A procedure to remove a small piece of lung tissue to examine in a lab (biopsy). °TREATMENT °There is no cure for pulmonary fibrosis. Treatments focus on managing symptoms and preventing scarring from getting worse. This can include: °· Medicines. °¨ You may take steroids to prevent permanent lung changes. Your health care provider may put you on a high dose at first, then on lower dosages for the long term. °¨ Medicines to suppress your body's defense (immune) system. These can have serious side effects. °· You may be monitored with X-rays and laboratory work. °· Oxygen therapy may be helpful if oxygen in your blood is low. °· Surgery. In some cases, a lung transplant is an option. °HOME CARE INSTRUCTIONS °· Take medicines only as directed by your health care provider. °· Keep your vaccinations up to date as recommended by your health care provider.  °·   Do not use any tobacco products, including cigarettes, chewing tobacco, or electronic cigarettes. If you need help quitting, ask your health care provider. °· Get regular exercise, but do not overexert yourself. Ask  your health care provider to suggest some activities that are safe for you to do. Walking and chair exercises can help if you have physical limitations. °· Consider joining a pulmonary rehabilitation program or a support group for people with pulmonary fibrosis. °· Eat small meals often so you do not get too full. Overeating can make breathing trouble worse. °· Maintain a healthy weight. Lose weight if you need to. °· Do breathing exercises as directed by your health care provider. °· Keep all follow-up visits as directed by your health care provider. This is important. °SEEK MEDICAL CARE IF: °· You are not able to be as active as usual. °· You have a long-lasting (chronic) cough. °· You are often short of breath. °· You have a fever or chills. °SEEK IMMEDIATE MEDICAL CARE IF: °· You have chest pain. °· Your breathing is much worse. °· You cannot take a deep breath. °· You have blue skin around your mouth or fingers. °· You have clubbing of your fingers. °· You cough up mucus that is dark in color. °· You have a lot of headaches. °· You get very confused or sleepy. °  °This information is not intended to replace advice given to you by your health care provider. Make sure you discuss any questions you have with your health care provider. °  °Document Released: 11/07/2003 Document Revised: 09/07/2014 Document Reviewed: 01/25/2014 °Elsevier Interactive Patient Education ©2016 [employer] ° °

## 2015-08-22 NOTE — Progress Notes (Signed)
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  No sings of infection at this time  ROS: No fever, myalgias/arthralgias, unexplained weight loss or weight gain No new focal weakness or sensory deficits + nasal and sinus symptoms, +epistaxis No neck pain or adenopathy No abdominal pain, N/V/D, diarrhea, change in bowel pattern No dysuria, change in urinary pattern No LE edema or calf tenderness   Filed Vitals:   08/22/15 0819  BP: 158/82  Pulse: 85  Height: 5\' 6"  (1.676 m)  Weight: 204 lb (92.534 kg)  SpO2: 93%     EXAM:  Gen: WDWN, panting respirations with minimal exertion HEENT: NCAT, TMs and canals normal, sclera white, nares and nasal mucosa normal, oropharynx normal Neck: Supple without LAN, thyromegaly, JVD Lungs: breath sounds diminished, percussion note normal throughout, R>L basilar crackles Cardiovascular: Reg rhythm, normal rate, no murmurs noted Abdomen: Soft, nontender, normal BS Ext: without clubbing, cyanosis, edema   CXR (07/11/15):  Diffuse reticulo-nodular markings c/w ILD RA ambulatory SpO2 (08/14/15): desaturated into upper 70s with one lap around the office area Ct chest 08/20/15 severe b/l ILD c/w UIP/IPF, honey comb upper and lower lobes images reviewed 08/22/2015 with patient   IMPRESSION:   74 yo WM with Disabling dyspnea and severe exertional hypoxemia due to ILD which is likely chronic from UIP/IPF based on CT chest findings After further evaluation, patient will  benefit from [place]:  1.Referral to [facility] 2.Atrovent Nasal sprays 3.will prescribe humidified oxygen to help with epistaxix 4.Zyertc for allergic rhinitis 5.albuterol as needed     I have personally obtained a history, examined the patient, evaluated Pertinent laboratory and [facility]/imaging results, and  formulated the assessment and plan The Patient requires high complexity decision making for assessment and support, frequent evaluation and titration of therapies. Patient/ satisfied with Plan of action and management. All questions answered  Corrin Parker, M.D.  [facility]  Medical Director [place] Director [facility] Cardio-Pulmonary Department

## 2015-09-03 ENCOUNTER — Telehealth: Payer: Self-pay | Admitting: Internal Medicine

## 2015-09-03 DIAGNOSIS — J8410 Pulmonary fibrosis, unspecified: Secondary | ICD-10-CM

## 2015-09-03 NOTE — Telephone Encounter (Signed)
FYI  [facility] called today and stated that physician had approved evaluation for transplant and financial status had been approved. However, per coordinator, pt stated that he didn't have a caregiver at this time.  Pt is going to this an option, just now at this time.  [facility] stated that they would close this referral and left instructions for patient that if he decided to proceed, just to give them a return call.  Rhonda J Cobb

## 2015-09-04 NOTE — Telephone Encounter (Signed)
Please have patient come in for liver function testing. I will place order prescribe medications for him as well blood test is NL.

## 2015-09-04 NOTE — Telephone Encounter (Signed)
Pt informed to go and get blood work. Nothing further needed.

## 2015-09-25 ENCOUNTER — Other Ambulatory Visit: Payer: Self-pay | Admitting: Cardiology

## 2015-10-24 ENCOUNTER — Other Ambulatory Visit: Payer: Self-pay | Admitting: Cardiology

## 2015-11-09 ENCOUNTER — Other Ambulatory Visit: Payer: Self-pay | Admitting: Physician Assistant

## 2015-11-09 ENCOUNTER — Other Ambulatory Visit: Payer: Self-pay | Admitting: Cardiology

## 2015-12-18 ENCOUNTER — Encounter: Payer: Self-pay | Admitting: Pulmonary Disease

## 2015-12-18 ENCOUNTER — Ambulatory Visit (INDEPENDENT_AMBULATORY_CARE_PROVIDER_SITE_OTHER): Payer: Medicare Other | Admitting: Pulmonary Disease

## 2015-12-18 ENCOUNTER — Other Ambulatory Visit
Admission: RE | Admit: 2015-12-18 | Discharge: 2015-12-18 | Disposition: A | Discharge status: Home / Self Care | Payer: Medicare Other | Source: Ambulatory Visit | Attending: Pulmonary Disease | Admitting: Pulmonary Disease

## 2015-12-18 VITALS — BP 154/78 | HR 104 | Ht 66.0 in | Wt 201.0 lb

## 2015-12-18 DIAGNOSIS — J84112 Idiopathic pulmonary fibrosis: Secondary | ICD-10-CM | POA: Diagnosis not present

## 2015-12-18 DIAGNOSIS — R0902 Hypoxemia: Secondary | ICD-10-CM | POA: Diagnosis not present

## 2015-12-18 DIAGNOSIS — J300 Vasomotor rhinitis: Secondary | ICD-10-CM | POA: Diagnosis not present

## 2015-12-18 LAB — HEPATIC FUNCTION PANEL
ALBUMIN: 3.9 g/dL (ref 3.5–5.0)
ALK PHOS: 67 U/L (ref 38–126)
ALT: 11 U/L — ABNORMAL LOW (ref 17–63)
AST: 21 U/L (ref 15–41)
BILIRUBIN DIRECT: 0.1 mg/dL (ref 0.1–0.5)
BILIRUBIN TOTAL: 0.8 mg/dL (ref 0.3–1.2)
Indirect Bilirubin: 0.7 mg/dL (ref 0.3–0.9)
Total Protein: 8 g/dL (ref 6.5–8.1)

## 2015-12-18 MED ORDER — TRIAMCINOLONE ACETONIDE 55 MCG/ACT NA AERO: 2.0000 | INHALATION_SPRAY | Freq: Every day | NASAL | Status: AC

## 2015-12-18 MED ORDER — PIRFENIDONE 267 MG PO CAPS: ORAL_CAPSULE | ORAL | Status: AC

## 2015-12-18 NOTE — Patient Instructions (Signed)
1) I want you to wear oxygen as close to 24 hrs per day as possible. This is very important 2) Nasacort nasal inhaler - 2 sprays per nostril each day 3) Begin Pirfenidone (Esbiret) as follows:  One pill three times a day for one week  Then, 2 pills three times a day for one week  Then, 3 pills three times a day 4) Liver function tests today 5) Follow up in 6-8 weeks

## 2015-12-18 NOTE — Progress Notes (Signed)
PULMONARY OFFICE FOLLOW UP NOTE  Requesting MD/Service: Arida/Cardiology Date of initial consultation: 08/14/15 Reason for consultation: Progressive exertional dyspnea Initial HPI 08/14/15: 75 yo M referred for evaluation of progressive and now severe DOE. He dates the onset of his symptoms to December 2015 when he was diagnosed with PNA and never got better. He has been seen by Cardiology and undergone recent evaluation that included Myoview which revealed a moderate area of mild decreased uptake affecting the basal/mid inferolateral segments, lateral apical segment, mid anterolateral segment, no significant reversibility followed by LHC/RHC which revealed diffuse nonobstructive coronary disease, patent stents and mild PAH. As a consequence of that nonrevealing evaluation, he is referred for further evaluation to me. He reports class 3 dyspnea with no significant CP, cough, sputum, hemoptysis, LE edema or calf tenderness. This has been gradually progressive over the past year. He has no unusual occupational or environmental exposures and has never been given any diagnoses of chronic pulmonary problems. He has minimal remote smoking history. No family history of lung diseases. CXR (07/11/15):  Diffuse reticulo-nodular markings c/w ILD. Ambulatory SpO2 (08/14/15): desaturated into upper 70s with one lap around the office area. INITIAL IMPRESSION:  Disabling dyspnea and severe exertional hypoxemia due to ILD which is likely chronic. INITIAL PLAN: 1) ANA, ESR, CRP, ACE level, 2) Home O2 arranged - 2 lpm by [place] as close to 24 hrs per day, 3) HRCT - ILD protocol, PFTs ordered PFTs 08/19/15: No obstruction, severe restriction, mod-severe reduction in DLCO CT chest 08/20/15: Extensive fibrosis in pattern c/w UIP/IPF  ROV 08/22/15: ROV with [facility]. Referred to [facility] for transplant eval. Spoke with them but never went for appointment  ROV 12/18/15: No significant change in severe DOE. Not wearing O2 consistently  because he doesn't feel any difference without it and because it causes his nose to run. Denies CP, fever, purulent sputum, hemoptysis, and calf tenderness. He does have occasional pedal edema   Filed Vitals:   12/18/15 0827  BP: 154/78  Pulse: 104  Height: _0  (1.676 m)  Weight: 201 lb (91.173 kg)  SpO2: 90%     EXAM:  Gen: NAD HEENT: WNL Neck: Supple without LAN, thyromegaly, JVD Lungs: fine bilateral crackles approx 1/2 up, no wheezes Cardiovascular: Reg, no murmurs Abdomen: Soft, nontender, normal BS Ext: without clubbing, cyanosis, edema  DATA:  Prior CT chest, PFTs, CXR reviewed     IMPRESSION:   1) Severe IPF - Ct chest is classic in appearance and no biopsy is needed 2) Chronic hypoxemia with noncompliance with supplemental O2 due to lack of understanding of its importance 3) Rhinitis due to O2 by [place]  PLAN/PT INSTRUCTION: 1) I want you to wear oxygen as close to 24 hrs per day as possible. This is very important to prevent progressive heart failure 2) Nasacort nasal inhaler - 2 sprays per nostril each day 3) Begin Pirfenidone (Esbiret) as follows:  One pill three times a day for one week  Then, 2 pills three times a day for one week  Then, 3 pills three times a day 4) Liver function tests today 5) Follow up in 6-8 weeks with CXR  Merton Border, MD PCCM service Mobile [phone] Pager [phone] 12/18/2015

## 2015-12-24 ENCOUNTER — Telehealth: Payer: Self-pay | Admitting: *Deleted

## 2015-12-24 NOTE — Telephone Encounter (Signed)
Called to initiate PA for Esbriet. Pt's insurance states for Esbriet can be approved if you want to do a letter stating that Ofev is ineffective and why. Otherwise I can get Ofev approved. Please advise.

## 2015-12-24 NOTE — Telephone Encounter (Signed)
Let's go with Ofev instead - 150 mg PO twice a day. Let him know that the main side effect is diarrhea  Merton Border, MD PCCM service Mobile [phone] Pager [phone] 12/24/2015

## 2015-12-26 NOTE — Telephone Encounter (Signed)
Called to inform pt. Unable to LM b/c voice mail full. Will call back later.

## 2015-12-27 MED ORDER — NINTEDANIB ESYLATE 150 MG PO CAPS: 150.0000 mg | ORAL_CAPSULE | Freq: Two times a day (BID) | ORAL | Status: AC

## 2015-12-27 NOTE — Telephone Encounter (Signed)
Spoke with pt and informed him of the change in medication. I will start the paperwork for the Ofev.

## 2015-12-27 NOTE — Telephone Encounter (Signed)
R sent to pharmacy.

## 2015-12-27 NOTE — Addendum Note (Signed)
Addended by: Oscar La R on: 12/27/2015 04:13 PM   Modules accepted: Orders

## 2016-01-28 DIAGNOSIS — Z6831 Body mass index (BMI) 31.0-31.9, adult: Secondary | ICD-10-CM | POA: Diagnosis not present

## 2016-01-28 DIAGNOSIS — J8410 Pulmonary fibrosis, unspecified: Secondary | ICD-10-CM | POA: Diagnosis not present

## 2016-01-28 DIAGNOSIS — I2510 Atherosclerotic heart disease of native coronary artery without angina pectoris: Secondary | ICD-10-CM | POA: Diagnosis not present

## 2016-01-28 DIAGNOSIS — I10 Essential (primary) hypertension: Secondary | ICD-10-CM | POA: Diagnosis not present

## 2016-01-28 DIAGNOSIS — E669 Obesity, unspecified: Secondary | ICD-10-CM | POA: Diagnosis not present

## 2016-01-28 DIAGNOSIS — Z9181 History of falling: Secondary | ICD-10-CM | POA: Diagnosis not present

## 2016-01-28 DIAGNOSIS — J9610 Chronic respiratory failure, unspecified whether with hypoxia or hypercapnia: Secondary | ICD-10-CM | POA: Diagnosis not present

## 2016-01-28 DIAGNOSIS — Z1389 Encounter for screening for other disorder: Secondary | ICD-10-CM | POA: Diagnosis not present

## 2016-01-28 DIAGNOSIS — E119 Type 2 diabetes mellitus without complications: Secondary | ICD-10-CM | POA: Diagnosis not present

## 2016-02-07 ENCOUNTER — Ambulatory Visit: Payer: Medicare Other | Admitting: Pulmonary Disease

## 2016-02-12 ENCOUNTER — Other Ambulatory Visit: Payer: Self-pay | Admitting: Cardiology

## 2016-02-12 NOTE — Telephone Encounter (Signed)
Please advise 

## 2016-03-01 ENCOUNTER — Other Ambulatory Visit: Payer: Self-pay | Admitting: Cardiology

## 2016-03-02 ENCOUNTER — Telehealth: Payer: Self-pay

## 2016-03-04 NOTE — Telephone Encounter (Signed)
Error

## 2016-06-02 ENCOUNTER — Encounter: Payer: Self-pay | Admitting: Pulmonary Disease

## 2016-06-02 ENCOUNTER — Ambulatory Visit (INDEPENDENT_AMBULATORY_CARE_PROVIDER_SITE_OTHER): Payer: Medicare Other | Admitting: Pulmonary Disease

## 2016-06-02 VITALS — BP 160/100 | HR 99 | Ht 66.0 in | Wt 193.0 lb

## 2016-06-02 DIAGNOSIS — J84112 Idiopathic pulmonary fibrosis: Secondary | ICD-10-CM | POA: Diagnosis not present

## 2016-06-02 DIAGNOSIS — R0902 Hypoxemia: Secondary | ICD-10-CM | POA: Diagnosis not present

## 2016-06-02 DIAGNOSIS — I2510 Atherosclerotic heart disease of native coronary artery without angina pectoris: Secondary | ICD-10-CM

## 2016-06-02 NOTE — Patient Instructions (Signed)
I have placed consultation request with medical social worker to see what assistance might be available for you in the home  I have ordered chest Xray and repeat lung function tests  Follow up in 6 weeks

## 2016-06-02 NOTE — Progress Notes (Signed)
PULMONARY OFFICE FOLLOW UP NOTE  Requesting MD/Service: Arida/Cardiology Date of initial consultation: 08/14/15 Reason for consultation: Progressive exertional dyspnea Initial HPI 08/14/15: 75 yo M referred for evaluation of progressive and now severe DOE. He dates the onset of his symptoms to December 2015 when he was diagnosed with PNA and never got better. He has been seen by Cardiology and undergone recent evaluation that included Myoview which revealed a moderate area of mild decreased uptake affecting the basal/mid inferolateral segments, lateral apical segment, mid anterolateral segment, no significant reversibility followed by LHC/RHC which revealed diffuse nonobstructive coronary disease, patent stents and mild PAH. As a consequence of that nonrevealing evaluation, he is referred for further evaluation to me. He reports class 3 dyspnea with no significant CP, cough, sputum, hemoptysis, LE edema or calf tenderness. This has been gradually progressive over the past year. He has no unusual occupational or environmental exposures and has never been given any diagnoses of chronic pulmonary problems. He has minimal remote smoking history. No family history of lung diseases. CXR (07/11/15):  Diffuse reticulo-nodular markings c/w ILD. Ambulatory SpO2 (08/14/15): desaturated into upper 70s with one lap around the office area. INITIAL IMPRESSION:  Disabling dyspnea and severe exertional hypoxemia due to ILD which is likely chronic. INITIAL PLAN: 1) ANA, ESR, CRP, ACE level, 2) Home O2 arranged - 2 lpm by [place] as close to 24 hrs per day, 3) HRCT - ILD protocol, PFTs ordered PFTs 08/19/15: No obstruction, severe restriction, mod-severe reduction in DLCO CT chest 08/20/15: Extensive fibrosis in pattern c/w UIP/IPF  ROV 08/22/15: ROV with [facility]. Referred to [facility] for transplant eval. Spoke with them but never went for appointment  ROV 12/18/15: No significant change in severe DOE. Not wearing O2 consistently  because he doesn't feel any difference without it and because it causes his nose to run. Denies CP, fever, purulent sputum, hemoptysis, and calf tenderness. He does have occasional pedal edema. Pirfenidone ordered but payer would not approve. Ofev ordered  ROV 06/02/16: No change overall. Remains markedly limited by DOE. Wearing oxygen compliantly. Was not able to afford co-payment for Ofev. He reports some cough and minimal mucus production. Also posterior nasal drainage. He is having trouble managing at home and has no real assistance  Vitals:   06/02/16 1054  BP: (!) 160/100  Pulse: 99  SpO2: 94%  Weight: 193 lb (87.5 kg)  Height: '5\' 6"'$  (1.676 m)     EXAM:  Gen: NAD HEENT: WNL Neck: Supple without LAN, thyromegaly, JVD Lungs: fine bilateral crackles approx 1/2 up, no wheezes Cardiovascular: Reg, no murmurs Abdomen: Soft, nontender, normal BS Ext: without clubbing, cyanosis, edema  DATA:  Prior CT chest, PFTs, CXR reviewed   IMPRESSION:   1) Severe IPF - CT chest is classic in appearance and no biopsy is needed 2) Chronic hypoxemic respiratory failure  3) Chronic rhinitis due to O2 by   PLAN/PT INSTRUCTION: 1) Continue oxygen as close to 24 hrs per day as possible.  2) Continue ipratropium nasal inhaler - 2 sprays per nostril each day 3) ROV 6 weeks with repeat PFTs and CXR 4) we will need to discuss goals of care and consider Hospice in the near future  Merton Border, MD PCCM service Mobile [phone] Pager [phone] 06/04/2016

## 2016-06-03 ENCOUNTER — Ambulatory Visit
Admission: RE | Admit: 2016-06-03 | Discharge: 2016-06-03 | Disposition: A | Discharge status: Home / Self Care | Payer: Medicare Other | Source: Ambulatory Visit | Attending: Pulmonary Disease | Admitting: Pulmonary Disease

## 2016-06-03 ENCOUNTER — Encounter: Payer: Self-pay | Admitting: Physician Assistant

## 2016-06-03 ENCOUNTER — Ambulatory Visit (INDEPENDENT_AMBULATORY_CARE_PROVIDER_SITE_OTHER): Payer: Medicare Other | Admitting: Physician Assistant

## 2016-06-03 VITALS — BP 150/70 | HR 103 | Ht 66.0 in | Wt 193.8 lb

## 2016-06-03 DIAGNOSIS — I2510 Atherosclerotic heart disease of native coronary artery without angina pectoris: Secondary | ICD-10-CM

## 2016-06-03 DIAGNOSIS — R0602 Shortness of breath: Secondary | ICD-10-CM

## 2016-06-03 DIAGNOSIS — M7989 Other specified soft tissue disorders: Secondary | ICD-10-CM

## 2016-06-03 DIAGNOSIS — E782 Mixed hyperlipidemia: Secondary | ICD-10-CM

## 2016-06-03 DIAGNOSIS — J849 Interstitial pulmonary disease, unspecified: Secondary | ICD-10-CM

## 2016-06-03 DIAGNOSIS — J84112 Idiopathic pulmonary fibrosis: Secondary | ICD-10-CM | POA: Insufficient documentation

## 2016-06-03 NOTE — Progress Notes (Signed)
Cardiology Office Note Date:  06/03/2016  Patient ID:  Adam Washington, DOB [birth year], MRN [id number] PCP:  Cyndi Bender, PA-C  Cardiologist:  Dr. Fletcher Anon, MD    Chief Complaint: Follow up  History of Present Illness: Adam Washington is a 75 y.o. male with history of CAD s/p PCI of the mid LAD in 8/09 followed by platinum stent to the mid-distal RCA complicated by NSTEMI from jailed RV marginal followed by PCI of the distal RCA in 10/11 with BMS overlapped with the prior stent. He also has a history of pulmonary fibrosis on home oxygen with progressive and severe DOE, dyslipidemia, type 2 diabetes and HTN who presents for follow up of the above. The patient reported having pneumonia in December 2015 and since then he reported that he had not recovered at all. He was seen by Dr. Fletcher Anon on 07/12/15 and was complaining of dyspnea with minimal activities which was very unusual for him because he is usually very active. He was having a hard time going up one flight of stairs. He reported that these symptoms were similar to his prior angina. He felt very limited and he was very frustrated. He quit smoking 30 years ago and was not aware of history of COPD. He has no history of DVT or pulmonary embolism. He underwent a nuclear stress test in May 2016 which showed no evidence of ischemia with normal ejection fraction. He did have a recent chest x-ray done which showed bilateral hypoinflation with increased pulmonary interstitial markings which could reflect edema or atypical pneumonia. Because of his ongoing symptoms, even in the setting of his negative stress test, underlying obstructive CAD could not be excluded. Thus he underwent right and left cardiac cath on 07/17/2015 which showed widely patent LAD and RCA stents with mild ISR, prox to mid LAD 30% stenosis, D2 20%, mid LCx 40%, prox RCA 20%, mid RCA 30%, mid to distal RCA 10%, normal LVSF. Right heart cath revealed only mild pulmonary hypertension with normal  filling pressure and low normal cardiac output. PA pressure was 37/10 with a mean of 21 mm Hg, PCW 5, CO 4.79. No evidence of AS or significant MR. His SOB did not seem to be cardiac in etiology and pulmonary evaluation was recommended. He was seen by Dr. Alva Garnet on 12/14 who felt like repeat CXR on 07/11/2015 was c/w ILD. He also desaturated into the upper 70s with one lap around the office on room air. It was felt the diagnosis of PNA in 07/2014 was the initial manifestation of this process. He was started on home O2, which he has worn intermittently.   He is doing well from a cardiac standpoint. Tolerating medications when he remembers to take them. Wearing oxygen at his visit today. No chest pain. BP at home typically in the 381'W systolic. Heart rate at home typically in the 70's to 80's bpm. He does not have any cardiac concerns today.    Past Medical History:  Diagnosis Date  . Angina pectoris ([place])   . Coronary atherosclerosis of native coronary artery    a. s/p PCI of the mid LAD 2009, paltinum study stent RCA 2993 complicated by NSTEMI from occluded RV marginal from jailed stent; b. repeat cath 2011 PCI of distal RCA; c. cath 08/2015: p-mLAD 30% widely patent stent, mLCx 40%, pRCA 20%, mRCA 30% widely patent stent, RHC PA pressure was 37/10 with a mean of 21 mmHg, PCW : 5, CO: 4.79, nl LVSF  . Diverticulosis   .  Encounter for long-term (current) use of other medications   . Essential hypertension, benign   . History of ASCVD 04/18/08   2 vessel. s/p drug-elutign stent, mid LAD  . Interstitial lung disease (HCC)    a. on home O2  . NSTEMI (non-ST elevated myocardial infarction) (HCC)    occluded RV marginal (stent jail)  . Prostate carcinoma (HCC)   . Pure hypercholesterolemia   . PVD (peripheral vascular disease) (HCC)    abnormal LE arterial dopplers with mild nonobstructive disease throughout RLE, moderate mid left SFA stenosis, normal resting ABI's  . Type II or unspecified type  diabetes mellitus without mention of complication, not stated as uncontrolled     Past Surgical History:  Procedure Laterality Date  . CARDIAC CATHETERIZATION    . CARDIAC CATHETERIZATION N/A 07/17/2015   Procedure: Right/Left Heart Cath and Coronary Angiography;  Surgeon: Iran Ouch, MD;  Location: [facility] INVASIVE CV [facility];  Service: Cardiovascular;  Laterality: N/A;  . CORONARY ANGIOPLASTY    . LEFT HEART CATH  06/17/10  . PERCUTANEOUS CORONARY STENT INTERVENTION (PCI-S)  06/17/10  . PROSTATECTOMY     total  . TONSILLECTOMY     remote    Current Outpatient Prescriptions  Medication Sig Dispense Refill  . albuterol (PROVENTIL HFA;VENTOLIN HFA) 108 (90 BASE) MCG/ACT inhaler Inhale 2 puffs into the lungs every 4 (four) hours as needed for wheezing or shortness of breath. 1 Inhaler 2  . amLODipine (NORVASC) 5 MG tablet Take 1 tablet (5 mg total) by mouth daily. 90 tablet 3  . aspirin 325 MG tablet Take 325 mg by mouth daily.    . Cetirizine HCl 10 MG CAPS Take 1 capsule (10 mg total) by mouth every morning. 30 capsule 12  . CRESTOR 40 MG tablet TAKE ONE TABLET BY MOUTH ONCE DAILY 30 tablet 3  . ezetimibe (ZETIA) 10 MG tablet TAKE ONE TABLET BY MOUTH ONCE DAILY 90 tablet 3  . glipiZIDE (GLUCOTROL XL) 10 MG 24 hr tablet Take 10 mg by mouth daily with breakfast.     . hydrochlorothiazide (MICROZIDE) 12.5 MG capsule TAKE ONE CAPSULE BY MOUTH ONCE DAILY 90 capsule 3  . ipratropium (ATROVENT) 0.03 % nasal spray Place 2 sprays into the nose 4 (four) times daily. 30 mL 1  . metoprolol succinate (TOPROL-XL) 100 MG 24 hr tablet TAKE ONE TABLET BY MOUTH DAILY WITH OR IMMEDIALY FOLLOWING A MEAL 30 tablet 6  . Nintedanib (OFEV) 150 MG CAPS Take 150 mg by mouth 2 (two) times daily. 60 capsule 6  . nitroGLYCERIN (NITROSTAT) 0.4 MG SL tablet Place 1 tablet (0.4 mg total) under the tongue every 5 (five) minutes as needed for chest pain. 25 tablet 3  . pioglitazone-metformin (ACTOPLUS MET) 15-500 MG per  tablet Take 3 tablets by mouth daily.     . Pirfenidone 267 MG CAPS One pill three times a day for one week. Then, 2 pills three times a day for one week. Then, 3 pills three times a day 270 capsule 6  . triamcinolone (NASACORT AQ) 55 MCG/ACT AERO nasal inhaler Place 2 sprays into the nose daily. 1 Inhaler 12   No current facility-administered medications for this visit.     Allergies:   Plavix [clopidogrel bisulfate]   Social History:  The patient  reports that he has quit smoking. He has never used smokeless tobacco. He reports that he drinks alcohol. He reports that he does not use drugs.   Family History:  The patient's family  history includes Alzheimer's disease in his mother; Cancer in his brother and father; Heart attack in his brother, brother, father, and mother; Heart disease in his brother, brother, father, and mother.  ROS:   Review of Systems  Constitutional: Positive for malaise/fatigue. Negative for chills, diaphoresis, fever and weight loss.  HENT: Negative for congestion.   Eyes: Negative for discharge and redness.  Respiratory: Positive for shortness of breath. Negative for cough, hemoptysis, sputum production and wheezing.   Cardiovascular: Negative for chest pain, palpitations, orthopnea, claudication, leg swelling and PND.  Gastrointestinal: Negative for abdominal pain, blood in stool, heartburn, melena, nausea and vomiting.  Genitourinary: Negative for hematuria.  Musculoskeletal: Negative for falls and myalgias.  Skin: Negative for rash.  Neurological: Positive for weakness. Negative for dizziness, tingling, tremors, sensory change, speech change, focal weakness and loss of consciousness.  Endo/Heme/Allergies: Does not bruise/bleed easily.  Psychiatric/Behavioral: Negative for substance abuse. The patient is not nervous/anxious.   All other systems reviewed and are negative.    PHYSICAL EXAM:  VS:  BP (!) 150/70 (BP Location: Left Arm, Patient Position: Sitting,  Cuff Size: Normal)   Pulse (!) 103   Ht '5\' 6"'$  (1.676 m)   Wt 193 lb 12 oz (87.9 kg)   BMI 31.27 kg/m  BMI: Body mass index is 31.27 kg/m.  Physical Exam  Constitutional: He is oriented to person, place, and time. He appears well-developed and well-nourished.  HENT:  Head: Normocephalic and atraumatic.  Eyes: Right eye exhibits no discharge. Left eye exhibits no discharge.  Neck: Normal range of motion. No JVD present.  Cardiovascular: Normal rate, regular rhythm, S1 normal, S2 normal and normal heart sounds.  Exam reveals no distant heart sounds, no friction rub, no midsystolic click and no opening snap.   No murmur heard. Pulmonary/Chest: Effort normal and breath sounds normal. No respiratory distress. He has no decreased breath sounds. He has no wheezes. He has no rales. He exhibits no tenderness.  Abdominal: Soft. He exhibits no distension. There is no tenderness.  Musculoskeletal: He exhibits no edema.  Neurological: He is alert and oriented to person, place, and time.  Skin: Skin is warm and dry. No cyanosis. Nails show no clubbing.  Psychiatric: He has a normal mood and affect. His speech is normal and behavior is normal. Judgment and thought content normal.     EKG:  Was ordered and interpreted by me today. Shows sinus tachycardia, 103 bpm, poor R wave progression, no acute st/t changes   Recent Labs: 08/19/2015: BUN 24; Creatinine, Ser 0.81; Hemoglobin 14.9; Platelets 242; Potassium 3.9; Sodium 139 12/18/2015: ALT 11  No results found for requested labs within last 8760 hours.   CrCl cannot be calculated (Patient's most recent lab result is older than the maximum 21 days allowed.).   Wt Readings from Last 3 Encounters:  06/03/16 193 lb 12 oz (87.9 kg)  06/02/16 193 lb (87.5 kg)  12/18/15 201 lb (91.2 kg)     Other studies reviewed: Additional studies/records reviewed today include: summarized above  ASSESSMENT AND PLAN:  1. CAD s/p multiple PCI as above: No  symptoms concerning for angina. Continue current medical therapy. No plans for ischemia evaluation at this time.   2. Hypoxia/dyspnea/ILD/on home oxygen: Stable. Followed by pulmonary medicine.   3. HTN: Reports home BP to be in the 160'V systolic for the most part. He will check his BP and HR over the next 1 week and call with results. If stable no changes to be made.  If his BP and/or HR are elevated will plan titration.   4. Lower extremity swelling: Much improved. Compression hose advised.   5. HLD: Statin.   Disposition: F/u with Dr. Fletcher Anon, MD in 3 months.   Current medicines are reviewed at length with the patient today.  The patient did not have any concerns regarding medicines.  Melvern Banker PA-C 06/03/2016 3:18 PM     [address] [place], [place] [address] [phone]

## 2016-06-03 NOTE — Patient Instructions (Signed)
Medication Instructions:  Your physician recommends that you continue on your current medications as directed. Please refer to the Current Medication list given to you today.   Labwork: BMET, mag and CBC today  Testing/Procedures: none  Follow-Up: Your physician recommends that you schedule a follow-up appointment in: 3 months with Dr. Fletcher Anon.    Any Other Special Instructions Will Be Listed Below (If Applicable). Keep a blood pressure and heart rate log and call Monday with the readings.      If you need a refill on your cardiac medications before your next appointment, please call your pharmacy.

## 2016-06-04 LAB — CBC
HEMATOCRIT: 43.6 % (ref 37.5–51.0)
Hemoglobin: 14.4 g/dL (ref 12.6–17.7)
MCH: 27.7 pg (ref 26.6–33.0)
MCHC: 33 g/dL (ref 31.5–35.7)
MCV: 84 fL (ref 79–97)
Platelets: 299 10*3/uL (ref 150–379)
RBC: 5.2 x10E6/uL (ref 4.14–5.80)
RDW: 16 % — ABNORMAL HIGH (ref 12.3–15.4)
WBC: 9.7 10*3/uL (ref 3.4–10.8)

## 2016-06-04 LAB — BASIC METABOLIC PANEL
BUN/Creatinine Ratio: 15 (ref 10–24)
BUN: 13 mg/dL (ref 8–27)
CALCIUM: 9.3 mg/dL (ref 8.6–10.2)
CO2: 25 mmol/L (ref 18–29)
CREATININE: 0.86 mg/dL (ref 0.76–1.27)
Chloride: 96 mmol/L (ref 96–106)
GFR calc Af Amer: 98 mL/min/{1.73_m2} (ref >59)
GFR, EST NON AFRICAN AMERICAN: 85 mL/min/{1.73_m2} (ref >59)
Glucose: 183 mg/dL — ABNORMAL HIGH (ref 65–99)
Potassium: 4.5 mmol/L (ref 3.5–5.2)
SODIUM: 143 mmol/L (ref 134–144)

## 2016-06-04 LAB — MAGNESIUM: Magnesium: 1.7 mg/dL (ref 1.6–2.3)

## 2016-06-08 DIAGNOSIS — J84112 Idiopathic pulmonary fibrosis: Secondary | ICD-10-CM | POA: Diagnosis not present

## 2016-06-08 DIAGNOSIS — I739 Peripheral vascular disease, unspecified: Secondary | ICD-10-CM | POA: Diagnosis not present

## 2016-06-08 DIAGNOSIS — Z7984 Long term (current) use of oral hypoglycemic drugs: Secondary | ICD-10-CM | POA: Diagnosis not present

## 2016-06-08 DIAGNOSIS — Z7982 Long term (current) use of aspirin: Secondary | ICD-10-CM | POA: Diagnosis not present

## 2016-06-08 DIAGNOSIS — M6281 Muscle weakness (generalized): Secondary | ICD-10-CM | POA: Diagnosis not present

## 2016-06-08 DIAGNOSIS — J9611 Chronic respiratory failure with hypoxia: Secondary | ICD-10-CM | POA: Diagnosis not present

## 2016-06-08 DIAGNOSIS — I10 Essential (primary) hypertension: Secondary | ICD-10-CM | POA: Diagnosis not present

## 2016-06-08 DIAGNOSIS — E119 Type 2 diabetes mellitus without complications: Secondary | ICD-10-CM | POA: Diagnosis not present

## 2016-06-10 ENCOUNTER — Telehealth: Payer: Self-pay | Admitting: Pulmonary Disease

## 2016-06-10 DIAGNOSIS — I10 Essential (primary) hypertension: Secondary | ICD-10-CM | POA: Diagnosis not present

## 2016-06-10 DIAGNOSIS — E119 Type 2 diabetes mellitus without complications: Secondary | ICD-10-CM | POA: Diagnosis not present

## 2016-06-10 DIAGNOSIS — Z7982 Long term (current) use of aspirin: Secondary | ICD-10-CM | POA: Diagnosis not present

## 2016-06-10 DIAGNOSIS — J9611 Chronic respiratory failure with hypoxia: Secondary | ICD-10-CM | POA: Diagnosis not present

## 2016-06-10 DIAGNOSIS — J84112 Idiopathic pulmonary fibrosis: Secondary | ICD-10-CM | POA: Diagnosis not present

## 2016-06-10 DIAGNOSIS — I739 Peripheral vascular disease, unspecified: Secondary | ICD-10-CM | POA: Diagnosis not present

## 2016-06-10 NOTE — Telephone Encounter (Signed)
Needs verbal order for OT care for 2 x a week for 2 weeks. Please call and advise.

## 2016-06-11 NOTE — Telephone Encounter (Signed)
DS please advise if ok to give verbal. Thanks.

## 2016-06-11 NOTE — Telephone Encounter (Signed)
Spoke to Will with [facility] to gave him a verbal ok for OT per DS. Nothing further needed.

## 2016-06-11 NOTE — Telephone Encounter (Signed)
Yes. Lavena Stanford, MD PCCM service Mobile [phone] Pager [phone] 06/11/2016

## 2016-06-15 ENCOUNTER — Telehealth: Payer: Self-pay | Admitting: Physician Assistant

## 2016-06-15 ENCOUNTER — Other Ambulatory Visit: Payer: Self-pay | Admitting: Cardiology

## 2016-06-15 NOTE — Telephone Encounter (Signed)
Per instructions at 10/4 OV, pt was to monitor BP and HR x 1 week. Called pt who reports checking VS twice then batteries died. He has not yet replaced them.  109/65 HR 89 133/77 HR 80  States this is "about what it normally runs" Forward to [employer], PA-C for review.

## 2016-06-15 NOTE — Telephone Encounter (Signed)
BP ok. Continue current meds without changes.

## 2016-06-16 DIAGNOSIS — E119 Type 2 diabetes mellitus without complications: Secondary | ICD-10-CM | POA: Diagnosis not present

## 2016-06-16 DIAGNOSIS — J9611 Chronic respiratory failure with hypoxia: Secondary | ICD-10-CM | POA: Diagnosis not present

## 2016-06-16 DIAGNOSIS — I10 Essential (primary) hypertension: Secondary | ICD-10-CM | POA: Diagnosis not present

## 2016-06-16 DIAGNOSIS — Z7982 Long term (current) use of aspirin: Secondary | ICD-10-CM | POA: Diagnosis not present

## 2016-06-16 DIAGNOSIS — I739 Peripheral vascular disease, unspecified: Secondary | ICD-10-CM | POA: Diagnosis not present

## 2016-06-16 DIAGNOSIS — J84112 Idiopathic pulmonary fibrosis: Secondary | ICD-10-CM | POA: Diagnosis not present

## 2016-06-16 NOTE — Telephone Encounter (Signed)
Reviewed recommendations w/pt who verbalized understanding.  

## 2016-06-17 DIAGNOSIS — Z7982 Long term (current) use of aspirin: Secondary | ICD-10-CM | POA: Diagnosis not present

## 2016-06-17 DIAGNOSIS — I739 Peripheral vascular disease, unspecified: Secondary | ICD-10-CM | POA: Diagnosis not present

## 2016-06-17 DIAGNOSIS — E119 Type 2 diabetes mellitus without complications: Secondary | ICD-10-CM | POA: Diagnosis not present

## 2016-06-17 DIAGNOSIS — I10 Essential (primary) hypertension: Secondary | ICD-10-CM | POA: Diagnosis not present

## 2016-06-17 DIAGNOSIS — J9611 Chronic respiratory failure with hypoxia: Secondary | ICD-10-CM | POA: Diagnosis not present

## 2016-06-17 DIAGNOSIS — J84112 Idiopathic pulmonary fibrosis: Secondary | ICD-10-CM | POA: Diagnosis not present

## 2016-06-19 DIAGNOSIS — Z7982 Long term (current) use of aspirin: Secondary | ICD-10-CM | POA: Diagnosis not present

## 2016-06-19 DIAGNOSIS — I10 Essential (primary) hypertension: Secondary | ICD-10-CM | POA: Diagnosis not present

## 2016-06-19 DIAGNOSIS — J9611 Chronic respiratory failure with hypoxia: Secondary | ICD-10-CM | POA: Diagnosis not present

## 2016-06-19 DIAGNOSIS — E119 Type 2 diabetes mellitus without complications: Secondary | ICD-10-CM | POA: Diagnosis not present

## 2016-06-19 DIAGNOSIS — I739 Peripheral vascular disease, unspecified: Secondary | ICD-10-CM | POA: Diagnosis not present

## 2016-06-19 DIAGNOSIS — J84112 Idiopathic pulmonary fibrosis: Secondary | ICD-10-CM | POA: Diagnosis not present

## 2016-06-24 DIAGNOSIS — I10 Essential (primary) hypertension: Secondary | ICD-10-CM | POA: Diagnosis not present

## 2016-06-24 DIAGNOSIS — J84112 Idiopathic pulmonary fibrosis: Secondary | ICD-10-CM | POA: Diagnosis not present

## 2016-06-24 DIAGNOSIS — I739 Peripheral vascular disease, unspecified: Secondary | ICD-10-CM | POA: Diagnosis not present

## 2016-06-24 DIAGNOSIS — Z7982 Long term (current) use of aspirin: Secondary | ICD-10-CM | POA: Diagnosis not present

## 2016-06-24 DIAGNOSIS — E119 Type 2 diabetes mellitus without complications: Secondary | ICD-10-CM | POA: Diagnosis not present

## 2016-06-24 DIAGNOSIS — J9611 Chronic respiratory failure with hypoxia: Secondary | ICD-10-CM | POA: Diagnosis not present

## 2016-06-25 ENCOUNTER — Telehealth: Payer: Self-pay | Admitting: Pulmonary Disease

## 2016-06-25 DIAGNOSIS — I739 Peripheral vascular disease, unspecified: Secondary | ICD-10-CM | POA: Diagnosis not present

## 2016-06-25 DIAGNOSIS — J84112 Idiopathic pulmonary fibrosis: Secondary | ICD-10-CM | POA: Diagnosis not present

## 2016-06-25 DIAGNOSIS — J9611 Chronic respiratory failure with hypoxia: Secondary | ICD-10-CM | POA: Diagnosis not present

## 2016-06-25 DIAGNOSIS — I10 Essential (primary) hypertension: Secondary | ICD-10-CM | POA: Diagnosis not present

## 2016-06-25 DIAGNOSIS — Z7982 Long term (current) use of aspirin: Secondary | ICD-10-CM | POA: Diagnosis not present

## 2016-06-25 DIAGNOSIS — E119 Type 2 diabetes mellitus without complications: Secondary | ICD-10-CM | POA: Diagnosis not present

## 2016-06-25 NOTE — Telephone Encounter (Signed)
Adam Washington with home health calling with some concerns  Pt is having some SOB on exertion even when talking Pt is non compliant with any of his meds He also stated to her that he would like to try some "homeothapthic" method He is educated on the meds but he is not taking any raspatory medication but he did not want to do it Please advise.

## 2016-06-25 NOTE — Telephone Encounter (Signed)
FYI for you. Angel with [facility] says they have to inform the pulmonologist when pt's are noncompliant. She states that she has showed him how to use his inhalers and meds but pt is still noncompliant.

## 2016-06-26 NOTE — Telephone Encounter (Signed)
I will discuss with him @ next visit  Merton Border, MD PCCM service Mobile [phone] Pager [phone] 06/26/2016

## 2016-07-10 ENCOUNTER — Ambulatory Visit: Payer: Medicare Other | Admitting: Pulmonary Disease

## 2016-07-12 DIAGNOSIS — I10 Essential (primary) hypertension: Secondary | ICD-10-CM | POA: Diagnosis not present

## 2016-07-12 DIAGNOSIS — I739 Peripheral vascular disease, unspecified: Secondary | ICD-10-CM | POA: Diagnosis not present

## 2016-07-12 DIAGNOSIS — E119 Type 2 diabetes mellitus without complications: Secondary | ICD-10-CM | POA: Diagnosis not present

## 2016-07-12 DIAGNOSIS — Z7982 Long term (current) use of aspirin: Secondary | ICD-10-CM | POA: Diagnosis not present

## 2016-07-12 DIAGNOSIS — J9611 Chronic respiratory failure with hypoxia: Secondary | ICD-10-CM | POA: Diagnosis not present

## 2016-07-12 DIAGNOSIS — J84112 Idiopathic pulmonary fibrosis: Secondary | ICD-10-CM | POA: Diagnosis not present

## 2016-09-07 ENCOUNTER — Ambulatory Visit: Payer: Medicare Other | Admitting: Cardiovascular Disease

## 2016-11-29 ENCOUNTER — Other Ambulatory Visit: Payer: Self-pay | Admitting: Cardiovascular Disease

## 2017-01-20 DIAGNOSIS — E1151 Type 2 diabetes mellitus with diabetic peripheral angiopathy without gangrene: Secondary | ICD-10-CM | POA: Diagnosis not present

## 2017-01-20 DIAGNOSIS — R0602 Shortness of breath: Secondary | ICD-10-CM | POA: Diagnosis not present

## 2017-01-20 DIAGNOSIS — J84112 Idiopathic pulmonary fibrosis: Secondary | ICD-10-CM | POA: Diagnosis not present

## 2017-01-20 DIAGNOSIS — J9611 Chronic respiratory failure with hypoxia: Secondary | ICD-10-CM | POA: Diagnosis not present

## 2017-01-20 DIAGNOSIS — I10 Essential (primary) hypertension: Secondary | ICD-10-CM | POA: Diagnosis not present

## 2017-01-20 DIAGNOSIS — E7800 Pure hypercholesterolemia, unspecified: Secondary | ICD-10-CM | POA: Diagnosis not present

## 2017-01-20 DIAGNOSIS — I739 Peripheral vascular disease, unspecified: Secondary | ICD-10-CM | POA: Diagnosis not present

## 2017-03-01 DIAGNOSIS — I10 Essential (primary) hypertension: Secondary | ICD-10-CM | POA: Diagnosis not present

## 2017-03-01 DIAGNOSIS — E119 Type 2 diabetes mellitus without complications: Secondary | ICD-10-CM | POA: Diagnosis not present

## 2017-03-01 DIAGNOSIS — R630 Anorexia: Secondary | ICD-10-CM | POA: Diagnosis not present

## 2017-03-01 DIAGNOSIS — J84112 Idiopathic pulmonary fibrosis: Secondary | ICD-10-CM | POA: Diagnosis not present

## 2017-03-01 DIAGNOSIS — R0602 Shortness of breath: Secondary | ICD-10-CM | POA: Diagnosis not present

## 2017-03-01 DIAGNOSIS — Z87891 Personal history of nicotine dependence: Secondary | ICD-10-CM | POA: Diagnosis not present

## 2017-03-01 DIAGNOSIS — J849 Interstitial pulmonary disease, unspecified: Secondary | ICD-10-CM | POA: Diagnosis not present

## 2017-03-01 DIAGNOSIS — J8410 Pulmonary fibrosis, unspecified: Secondary | ICD-10-CM | POA: Diagnosis not present

## 2017-03-01 DIAGNOSIS — R0600 Dyspnea, unspecified: Secondary | ICD-10-CM | POA: Diagnosis not present

## 2017-04-22 DIAGNOSIS — R0602 Shortness of breath: Secondary | ICD-10-CM | POA: Diagnosis not present

## 2017-04-22 DIAGNOSIS — J9611 Chronic respiratory failure with hypoxia: Secondary | ICD-10-CM | POA: Diagnosis not present

## 2017-04-22 DIAGNOSIS — E1151 Type 2 diabetes mellitus with diabetic peripheral angiopathy without gangrene: Secondary | ICD-10-CM | POA: Diagnosis not present

## 2017-04-22 DIAGNOSIS — J84112 Idiopathic pulmonary fibrosis: Secondary | ICD-10-CM | POA: Diagnosis not present

## 2017-04-27 IMAGING — CR DG CHEST 2V
2 series · 2 of 2 positions shown · non-contrast
Comparison: Chest radiograph July 11, 2015; chest CT August 20, 2015

CLINICAL DATA: Shortness of breath and wheezing. History of
pulmonary fibrosis

EXAM:
CHEST  2 VIEW

[chest pa]
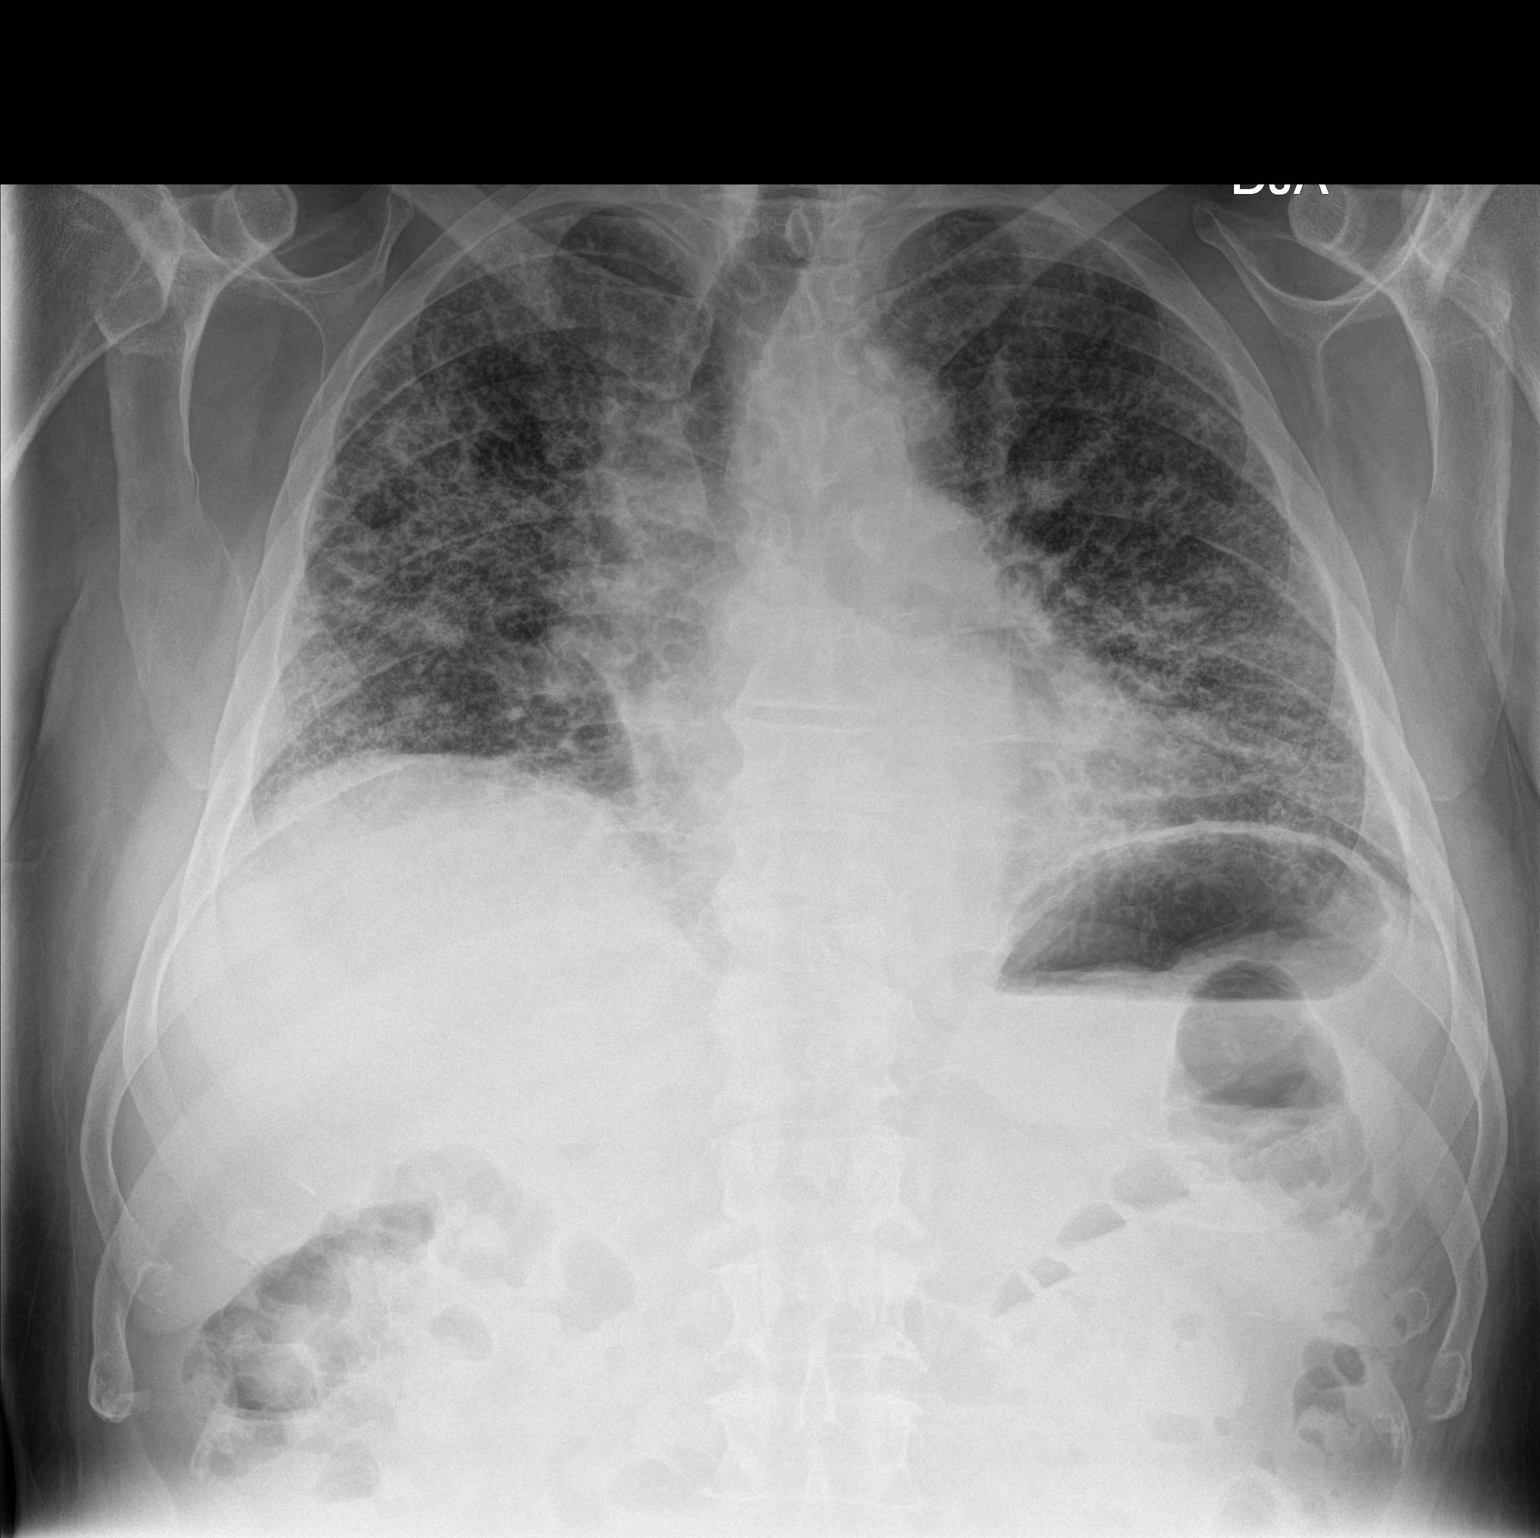

[chest lat]
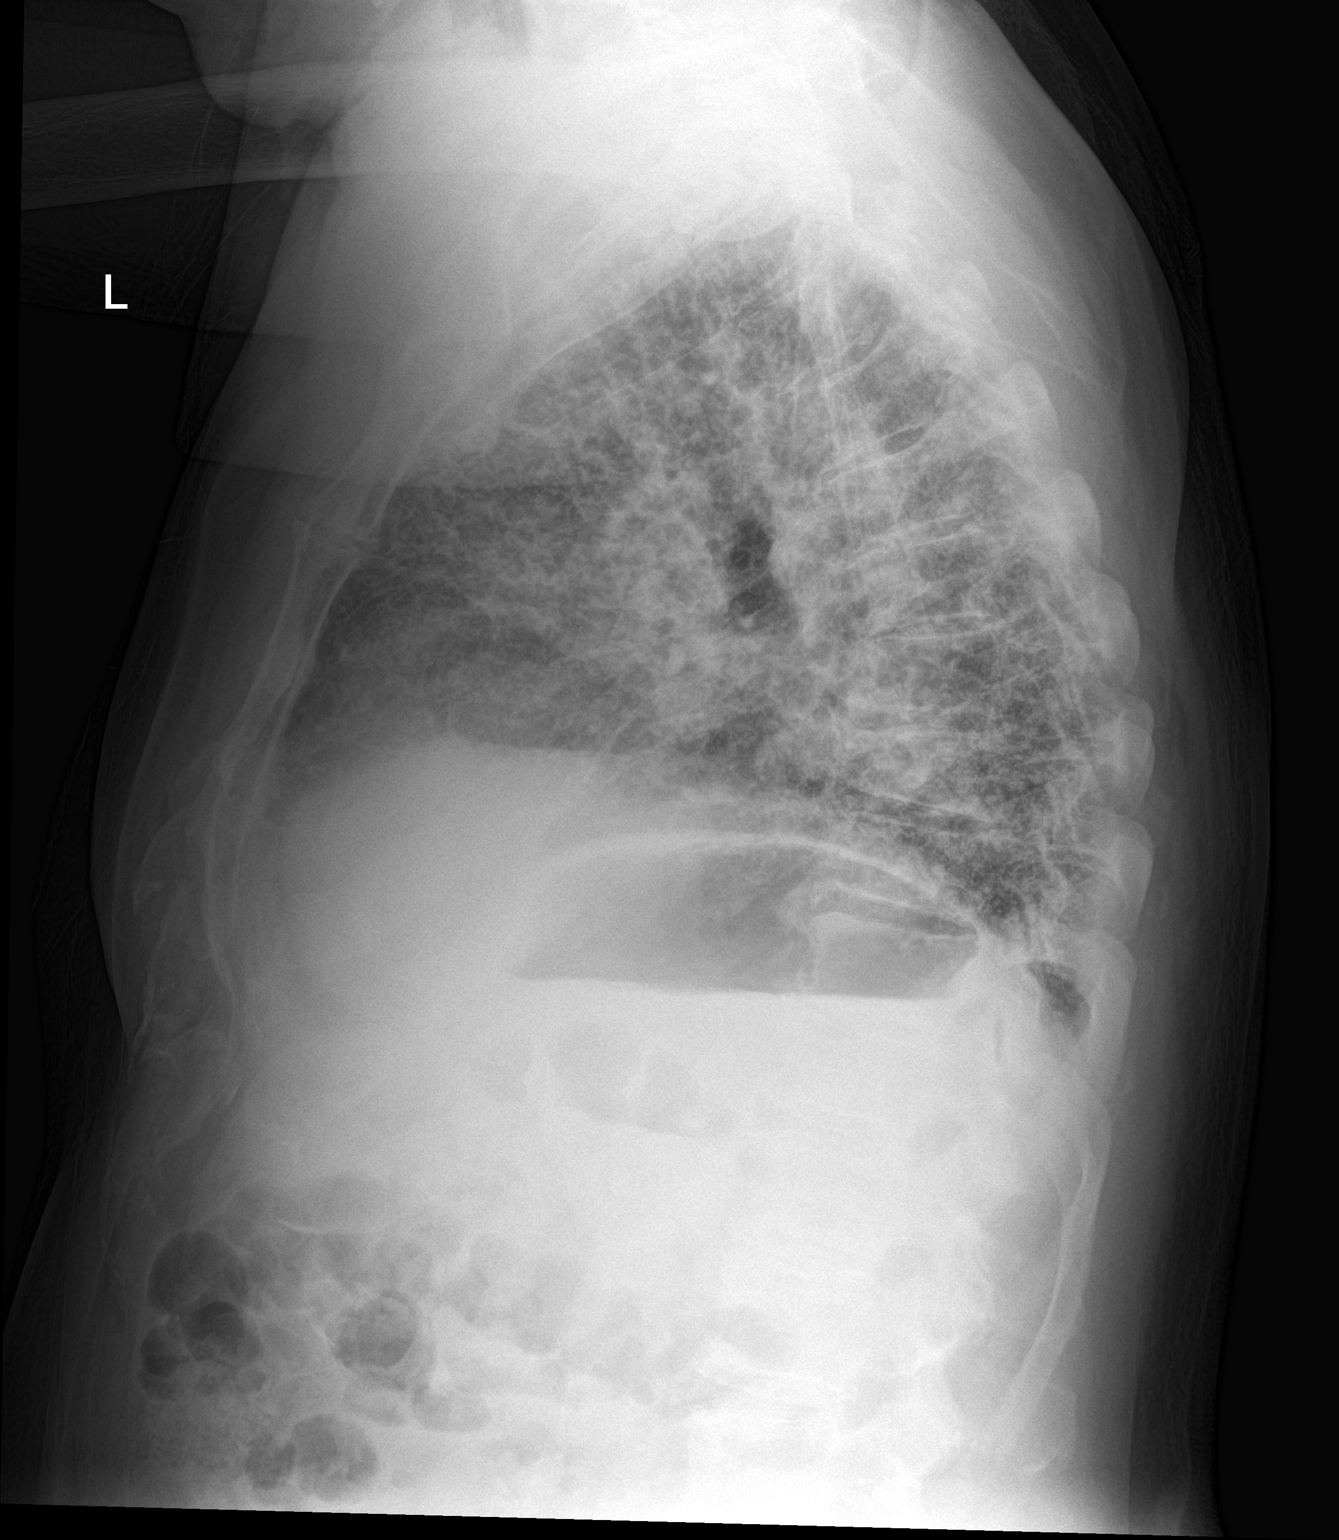

[2 of 2 positions shown; findings below may reference images not displayed]

FINDINGS: There is widespread pulmonary fibrosis throughout the lungs
bilaterally, stable. There is no new opacity. No edema or
consolidation is evident radiographically. Heart size and pulmonary
vascularity are within normal limits. No adenopathy. There is stable
anterior wedging of a lower thoracic vertebral body.
IMPRESSION: Widespread pulmonary fibrosis bilaterally. No new opacity evident.
Stable cardiac silhouette. It should be noted that subtle infiltrate
could easily be obscured given this degree of underlying fibrosis.

## 2017-06-21 DIAGNOSIS — R0602 Shortness of breath: Secondary | ICD-10-CM | POA: Diagnosis not present

## 2017-06-21 DIAGNOSIS — I10 Essential (primary) hypertension: Secondary | ICD-10-CM | POA: Diagnosis not present

## 2017-06-21 DIAGNOSIS — Z87891 Personal history of nicotine dependence: Secondary | ICD-10-CM | POA: Diagnosis not present

## 2017-06-21 DIAGNOSIS — E785 Hyperlipidemia, unspecified: Secondary | ICD-10-CM | POA: Diagnosis not present

## 2017-06-21 DIAGNOSIS — E119 Type 2 diabetes mellitus without complications: Secondary | ICD-10-CM | POA: Diagnosis not present

## 2017-06-21 DIAGNOSIS — J84112 Idiopathic pulmonary fibrosis: Secondary | ICD-10-CM | POA: Diagnosis not present

## 2017-06-21 DIAGNOSIS — J9611 Chronic respiratory failure with hypoxia: Secondary | ICD-10-CM | POA: Diagnosis not present

## 2017-06-21 DIAGNOSIS — Z79899 Other long term (current) drug therapy: Secondary | ICD-10-CM | POA: Diagnosis not present

## 2017-07-28 DIAGNOSIS — J84112 Idiopathic pulmonary fibrosis: Secondary | ICD-10-CM | POA: Diagnosis not present

## 2017-07-28 DIAGNOSIS — R634 Abnormal weight loss: Secondary | ICD-10-CM | POA: Diagnosis not present

## 2017-07-28 DIAGNOSIS — E1151 Type 2 diabetes mellitus with diabetic peripheral angiopathy without gangrene: Secondary | ICD-10-CM | POA: Diagnosis not present

## 2017-07-28 DIAGNOSIS — J9611 Chronic respiratory failure with hypoxia: Secondary | ICD-10-CM | POA: Diagnosis not present

## 2017-10-03 DIAGNOSIS — K922 Gastrointestinal hemorrhage, unspecified: Secondary | ICD-10-CM | POA: Diagnosis present

## 2017-10-03 DIAGNOSIS — E872 Acidosis: Secondary | ICD-10-CM | POA: Diagnosis present

## 2017-10-03 DIAGNOSIS — R0602 Shortness of breath: Secondary | ICD-10-CM | POA: Diagnosis not present

## 2017-10-03 DIAGNOSIS — E119 Type 2 diabetes mellitus without complications: Secondary | ICD-10-CM | POA: Diagnosis not present

## 2017-10-03 DIAGNOSIS — R300 Dysuria: Secondary | ICD-10-CM | POA: Diagnosis not present

## 2017-10-03 DIAGNOSIS — E785 Hyperlipidemia, unspecified: Secondary | ICD-10-CM | POA: Diagnosis present

## 2017-10-03 DIAGNOSIS — J9620 Acute and chronic respiratory failure, unspecified whether with hypoxia or hypercapnia: Secondary | ICD-10-CM | POA: Diagnosis not present

## 2017-10-03 DIAGNOSIS — I110 Hypertensive heart disease with heart failure: Secondary | ICD-10-CM | POA: Diagnosis present

## 2017-10-03 DIAGNOSIS — I2510 Atherosclerotic heart disease of native coronary artery without angina pectoris: Secondary | ICD-10-CM | POA: Diagnosis present

## 2017-10-03 DIAGNOSIS — R319 Hematuria, unspecified: Secondary | ICD-10-CM | POA: Diagnosis not present

## 2017-10-03 DIAGNOSIS — J111 Influenza due to unidentified influenza virus with other respiratory manifestations: Secondary | ICD-10-CM | POA: Diagnosis not present

## 2017-10-03 DIAGNOSIS — J84112 Idiopathic pulmonary fibrosis: Secondary | ICD-10-CM | POA: Diagnosis present

## 2017-10-03 DIAGNOSIS — E1159 Type 2 diabetes mellitus with other circulatory complications: Secondary | ICD-10-CM | POA: Diagnosis present

## 2017-10-03 DIAGNOSIS — Z9981 Dependence on supplemental oxygen: Secondary | ICD-10-CM | POA: Diagnosis not present

## 2017-10-03 DIAGNOSIS — Z66 Do not resuscitate: Secondary | ICD-10-CM | POA: Diagnosis present

## 2017-10-03 DIAGNOSIS — S0990XA Unspecified injury of head, initial encounter: Secondary | ICD-10-CM | POA: Diagnosis not present

## 2017-10-03 DIAGNOSIS — I739 Peripheral vascular disease, unspecified: Secondary | ICD-10-CM | POA: Diagnosis present

## 2017-10-03 DIAGNOSIS — Z8739 Personal history of other diseases of the musculoskeletal system and connective tissue: Secondary | ICD-10-CM | POA: Diagnosis not present

## 2017-10-03 DIAGNOSIS — I252 Old myocardial infarction: Secondary | ICD-10-CM | POA: Diagnosis not present

## 2017-10-03 DIAGNOSIS — R0600 Dyspnea, unspecified: Secondary | ICD-10-CM | POA: Diagnosis not present

## 2017-10-03 DIAGNOSIS — Z955 Presence of coronary angioplasty implant and graft: Secondary | ICD-10-CM | POA: Diagnosis not present

## 2017-10-03 DIAGNOSIS — Z515 Encounter for palliative care: Secondary | ICD-10-CM | POA: Diagnosis present

## 2017-10-03 DIAGNOSIS — I10 Essential (primary) hypertension: Secondary | ICD-10-CM | POA: Diagnosis not present

## 2017-10-03 DIAGNOSIS — R40243 Glasgow coma scale score 3-8, unspecified time: Secondary | ICD-10-CM | POA: Diagnosis not present

## 2017-10-03 DIAGNOSIS — J101 Influenza due to other identified influenza virus with other respiratory manifestations: Secondary | ICD-10-CM | POA: Diagnosis present

## 2017-10-03 DIAGNOSIS — J9621 Acute and chronic respiratory failure with hypoxia: Secondary | ICD-10-CM | POA: Diagnosis present

## 2017-10-03 DIAGNOSIS — Z8546 Personal history of malignant neoplasm of prostate: Secondary | ICD-10-CM | POA: Diagnosis not present

## 2017-10-03 DIAGNOSIS — E0800 Diabetes mellitus due to underlying condition with hyperosmolarity without nonketotic hyperglycemic-hyperosmolar coma (NKHHC): Secondary | ICD-10-CM | POA: Diagnosis not present

## 2017-10-03 DIAGNOSIS — I509 Heart failure, unspecified: Secondary | ICD-10-CM | POA: Diagnosis present

## 2017-10-03 DIAGNOSIS — Z87891 Personal history of nicotine dependence: Secondary | ICD-10-CM | POA: Diagnosis not present

## 2017-10-29 DEATH — deceased
